# Patient Record
Sex: Female | Born: 1965 | Race: White | Hispanic: No | Marital: Married | Smoking: Never smoker
Health system: Southern US, Community
[De-identification: ages and names within clinical notes are randomized; demographics above are authoritative.]

## PROBLEM LIST (undated history)

## (undated) DIAGNOSIS — I10 Essential (primary) hypertension: Secondary | ICD-10-CM

## (undated) DIAGNOSIS — T7840XA Allergy, unspecified, initial encounter: Secondary | ICD-10-CM

## (undated) DIAGNOSIS — K760 Fatty (change of) liver, not elsewhere classified: Secondary | ICD-10-CM

## (undated) DIAGNOSIS — R748 Abnormal levels of other serum enzymes: Secondary | ICD-10-CM

## (undated) DIAGNOSIS — R519 Headache, unspecified: Secondary | ICD-10-CM

## (undated) DIAGNOSIS — K219 Gastro-esophageal reflux disease without esophagitis: Secondary | ICD-10-CM

## (undated) DIAGNOSIS — E785 Hyperlipidemia, unspecified: Secondary | ICD-10-CM

## (undated) DIAGNOSIS — E079 Disorder of thyroid, unspecified: Secondary | ICD-10-CM

## (undated) DIAGNOSIS — R7303 Prediabetes: Secondary | ICD-10-CM

## (undated) DIAGNOSIS — F419 Anxiety disorder, unspecified: Secondary | ICD-10-CM

## (undated) HISTORY — DX: Essential (primary) hypertension: I10

## (undated) HISTORY — DX: Anxiety disorder, unspecified: F41.9

## (undated) HISTORY — DX: Hyperlipidemia, unspecified: E78.5

## (undated) HISTORY — DX: Allergy, unspecified, initial encounter: T78.40XA

## (undated) HISTORY — DX: Disorder of thyroid, unspecified: E07.9

---

## 2004-05-09 ENCOUNTER — Ambulatory Visit: Payer: Self-pay | Admitting: Family Medicine

## 2005-09-30 ENCOUNTER — Ambulatory Visit: Payer: Self-pay

## 2007-05-04 ENCOUNTER — Ambulatory Visit: Payer: Self-pay | Admitting: General Surgery

## 2007-05-08 ENCOUNTER — Ambulatory Visit: Payer: Self-pay | Admitting: General Surgery

## 2007-06-01 ENCOUNTER — Ambulatory Visit: Payer: Self-pay | Admitting: General Surgery

## 2007-06-04 ENCOUNTER — Ambulatory Visit: Payer: Self-pay | Admitting: General Surgery

## 2007-06-04 HISTORY — PX: CHOLECYSTECTOMY: SHX55

## 2009-06-11 IMAGING — US ABDOMEN ULTRASOUND
1 series · 17 of 25 positions shown · non-contrast
Comparison: none

REASON FOR EXAM: abdominal pain  Ct showed thick gallbladder wall
COMMENTS:

[Series 1: abdomen ultrasound · 17 of 70 slices shown]
[im 1/70]
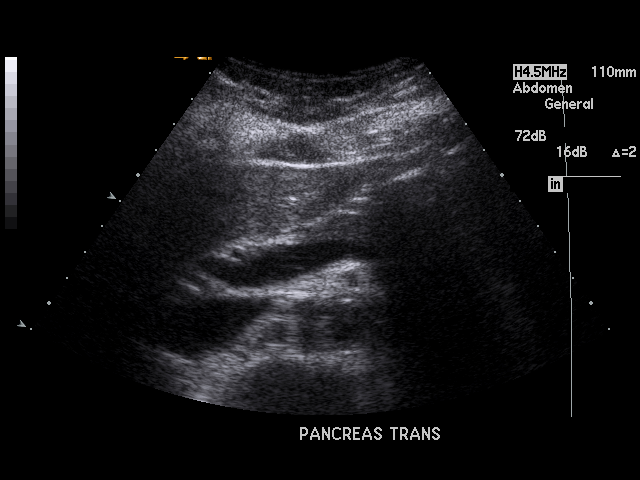
[im 6/70]
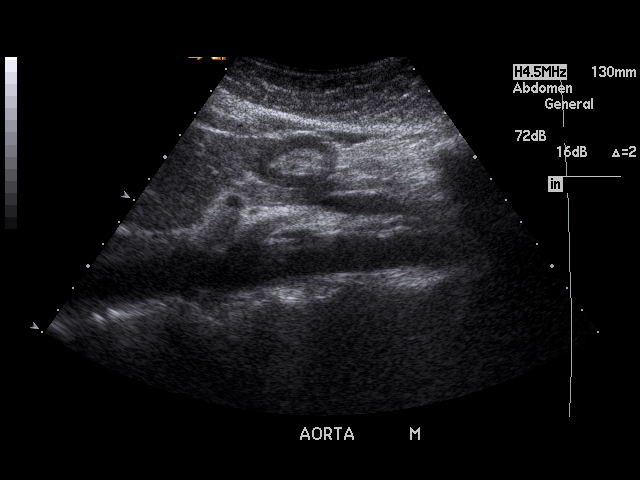
[im 9/70]
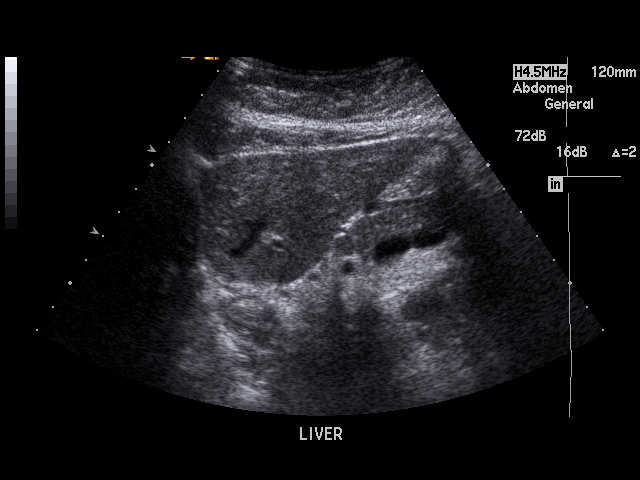
[im 15/70]
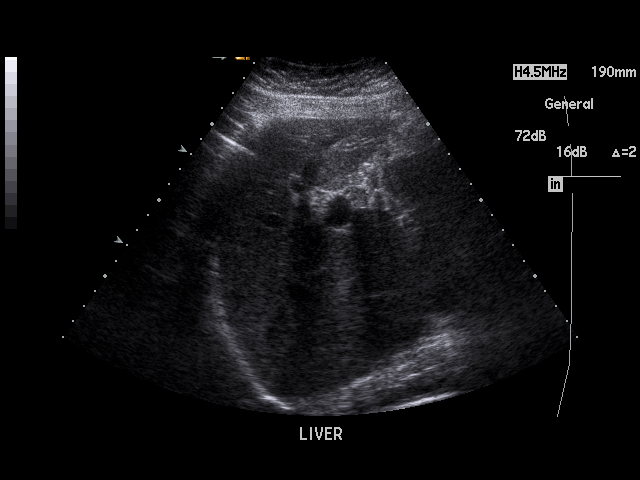
[im 18/70]
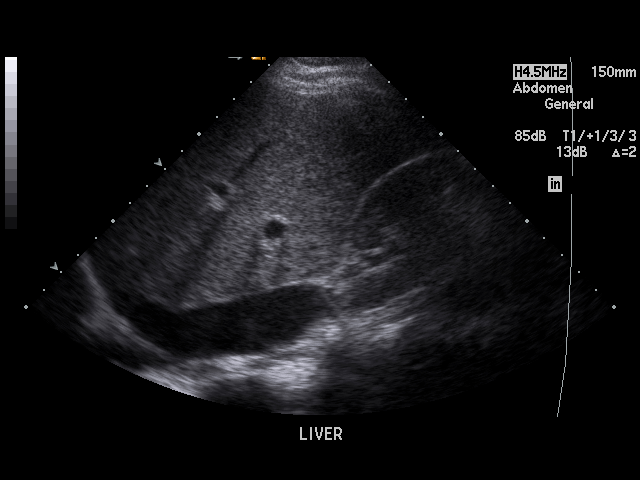
[im 24/70]
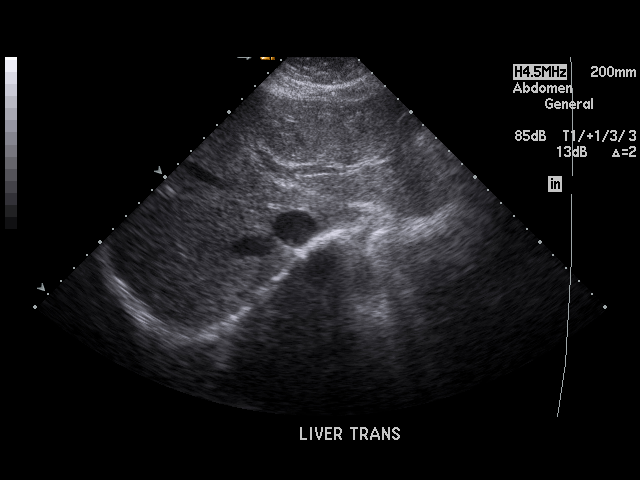
[im 26/70]
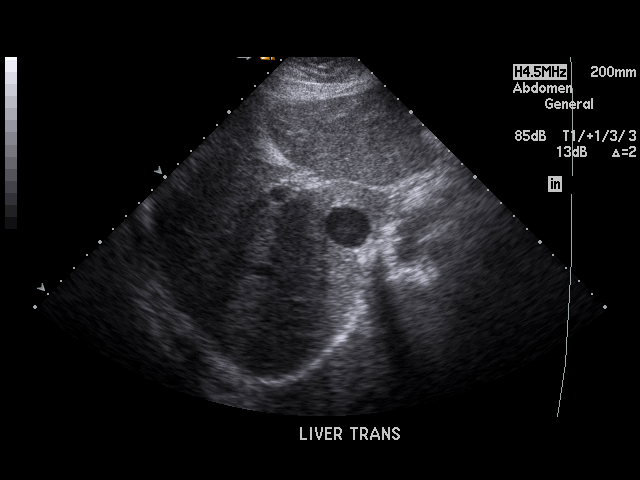
[im 32/70]
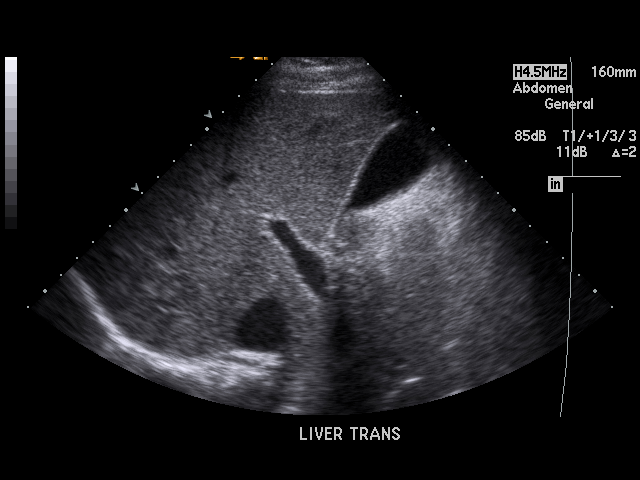
[im 35/70]
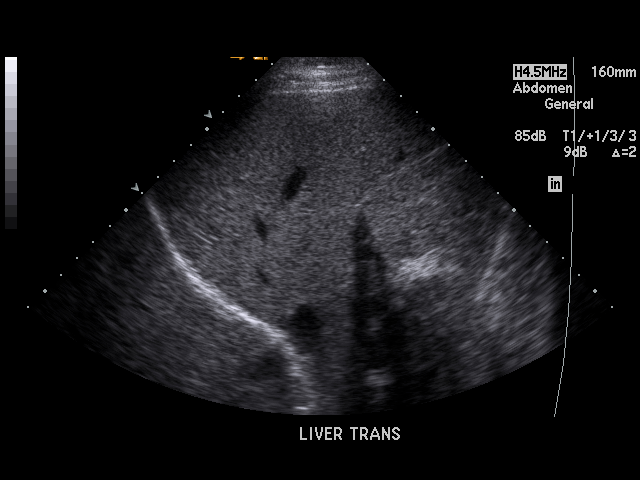
[im 38/70]
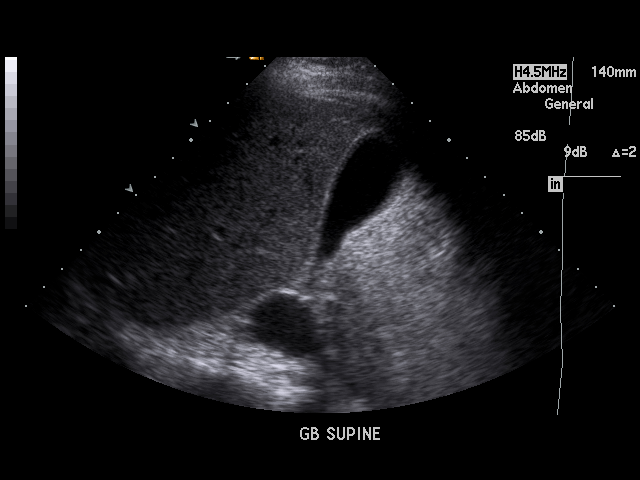
[im 44/70]
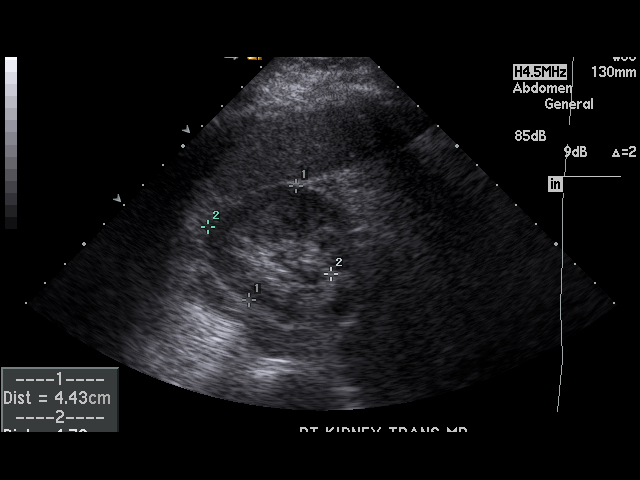
[im 47/70]
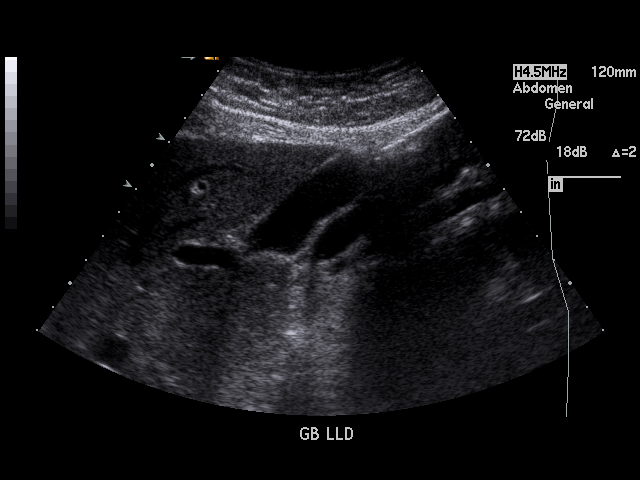
[im 52/70]
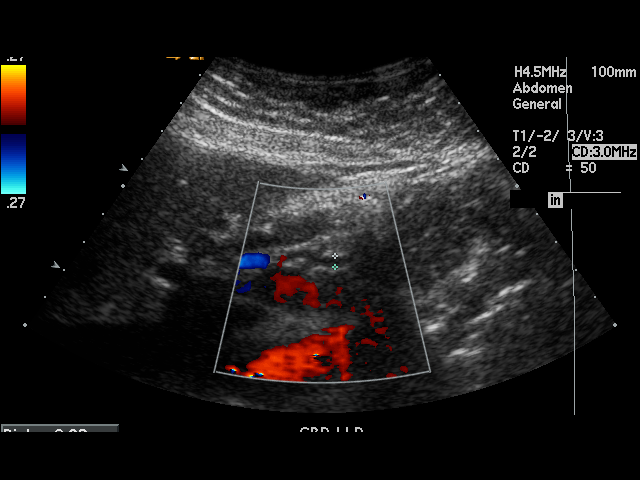
[im 55/70]
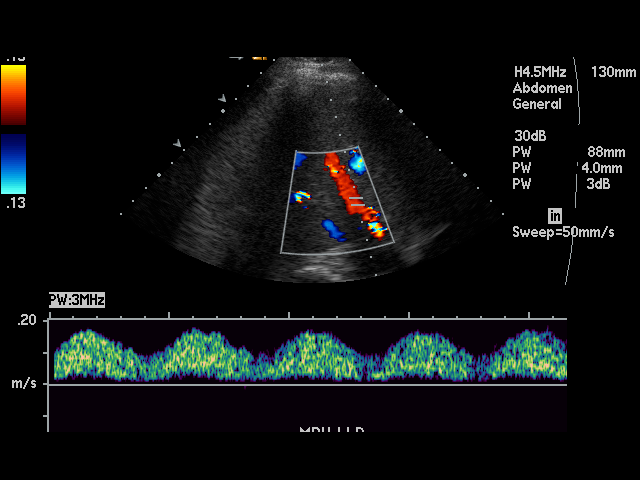
[im 61/70]
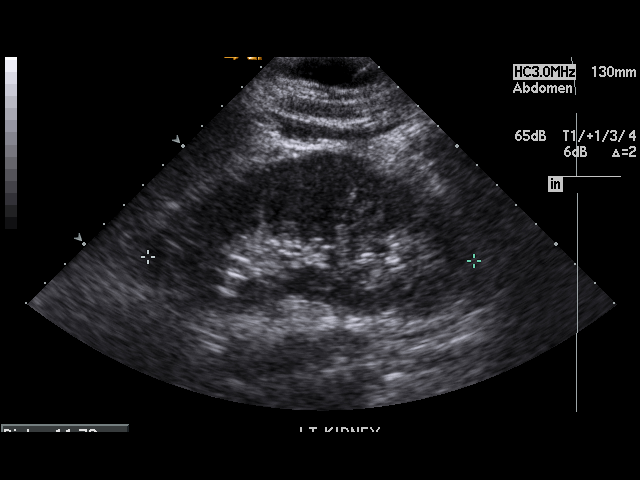
[im 64/70]
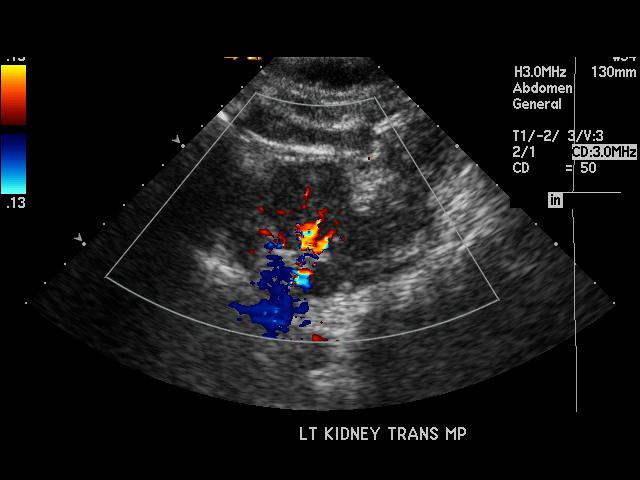
[im 70/70]
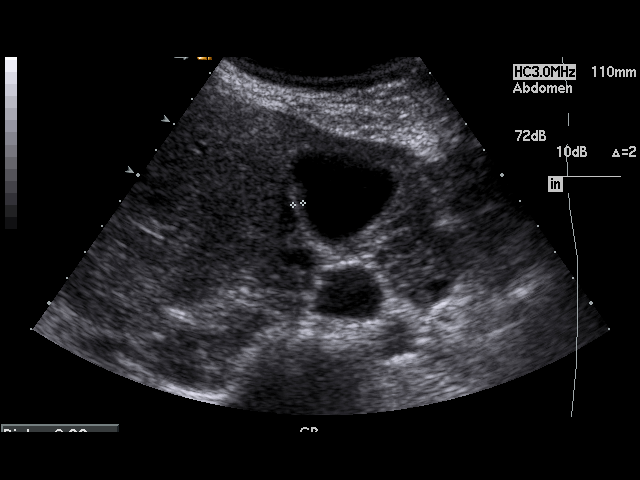

[17 of 25 positions shown; findings below may reference images not displayed]

PROCEDURE:     US  - US ABDOMEN GENERAL SURVEY  - May 08, 2007  [DATE]

RESULT:     The liver exhibits normal echotexture with no evidence of a mass
or of ductal dilation. Portal venous flow is normal in direction toward the
liver. The pancreas could only be partially demonstrated due to the presence
of bowel gas but was grossly normal. The gallbladder is adequately distended
with no evidence of stones, wall thickening, or pericholecystic fluid. The
common bile duct is normal at 3.2 mm in diameter. The spleen, abdominal
aorta, inferior vena cava, and kidneys are normal in appearance. There is no
evidence of ascites.
IMPRESSION: 1.Normal abdominal ultrasound examination. The gallbladder wall is top
normal in size at 3.3 mm but no pericholecystic fluid or sonographic
Murphy's sign was demonstrated.

## 2010-06-07 ENCOUNTER — Ambulatory Visit: Payer: Self-pay

## 2011-08-29 LAB — HM MAMMOGRAPHY: HM Mammogram: NEGATIVE

## 2012-10-15 ENCOUNTER — Ambulatory Visit: Payer: Self-pay | Admitting: Gastroenterology

## 2012-10-15 LAB — HEPATIC FUNCTION PANEL A (ARMC)
Alkaline Phosphatase: 123 U/L (ref 50–136)
Bilirubin, Direct: 0.1 mg/dL (ref 0.00–0.20)
SGOT(AST): 19 U/L (ref 15–37)
SGPT (ALT): 27 U/L (ref 12–78)
Total Protein: 8 g/dL (ref 6.4–8.2)

## 2012-10-27 ENCOUNTER — Ambulatory Visit: Payer: Self-pay | Admitting: Family Medicine

## 2012-12-29 LAB — HM COLONOSCOPY

## 2013-12-09 LAB — TSH: TSH: 0.6 u[IU]/mL (ref <=5.90)

## 2014-03-01 ENCOUNTER — Ambulatory Visit: Payer: Self-pay | Admitting: Family Medicine

## 2014-11-26 ENCOUNTER — Other Ambulatory Visit: Payer: Self-pay | Admitting: Family Medicine

## 2014-11-26 DIAGNOSIS — E039 Hypothyroidism, unspecified: Secondary | ICD-10-CM

## 2014-11-28 DIAGNOSIS — E039 Hypothyroidism, unspecified: Secondary | ICD-10-CM | POA: Insufficient documentation

## 2014-11-28 NOTE — Telephone Encounter (Signed)
Last OV 12/06/13

## 2014-12-10 ENCOUNTER — Other Ambulatory Visit: Payer: Self-pay | Admitting: Family Medicine

## 2014-12-10 DIAGNOSIS — I10 Essential (primary) hypertension: Secondary | ICD-10-CM

## 2014-12-12 DIAGNOSIS — I10 Essential (primary) hypertension: Secondary | ICD-10-CM | POA: Insufficient documentation

## 2014-12-12 NOTE — Telephone Encounter (Signed)
Last OV 12/06/13

## 2015-01-14 ENCOUNTER — Other Ambulatory Visit: Payer: Self-pay | Admitting: Family Medicine

## 2015-01-14 DIAGNOSIS — I10 Essential (primary) hypertension: Secondary | ICD-10-CM

## 2015-01-16 NOTE — Telephone Encounter (Signed)
Last OV 11/2013  Thanks,   -Laura  

## 2015-02-28 ENCOUNTER — Other Ambulatory Visit: Payer: Self-pay | Admitting: Family Medicine

## 2015-02-28 DIAGNOSIS — I10 Essential (primary) hypertension: Secondary | ICD-10-CM

## 2015-02-28 MED ORDER — HYDROCHLOROTHIAZIDE 12.5 MG PO TABS: 12.5000 mg | ORAL_TABLET | Freq: Every day | ORAL | Status: DC

## 2015-03-01 ENCOUNTER — Ambulatory Visit (INDEPENDENT_AMBULATORY_CARE_PROVIDER_SITE_OTHER): Payer: BLUE CROSS/BLUE SHIELD | Admitting: Family Medicine

## 2015-03-01 ENCOUNTER — Encounter: Payer: Self-pay | Admitting: Family Medicine

## 2015-03-01 VITALS — BP 134/82 | HR 84 | Temp 98.2°F | Resp 16 | Ht 65.0 in | Wt 156.0 lb

## 2015-03-01 DIAGNOSIS — F419 Anxiety disorder, unspecified: Secondary | ICD-10-CM | POA: Insufficient documentation

## 2015-03-01 DIAGNOSIS — M797 Fibromyalgia: Secondary | ICD-10-CM | POA: Diagnosis not present

## 2015-03-01 DIAGNOSIS — E039 Hypothyroidism, unspecified: Secondary | ICD-10-CM | POA: Diagnosis not present

## 2015-03-01 DIAGNOSIS — E162 Hypoglycemia, unspecified: Secondary | ICD-10-CM | POA: Insufficient documentation

## 2015-03-01 DIAGNOSIS — M1990 Unspecified osteoarthritis, unspecified site: Secondary | ICD-10-CM | POA: Insufficient documentation

## 2015-03-01 DIAGNOSIS — I10 Essential (primary) hypertension: Secondary | ICD-10-CM | POA: Diagnosis not present

## 2015-03-01 DIAGNOSIS — K589 Irritable bowel syndrome without diarrhea: Secondary | ICD-10-CM | POA: Insufficient documentation

## 2015-03-01 DIAGNOSIS — G43909 Migraine, unspecified, not intractable, without status migrainosus: Secondary | ICD-10-CM | POA: Insufficient documentation

## 2015-03-01 DIAGNOSIS — M2550 Pain in unspecified joint: Secondary | ICD-10-CM | POA: Insufficient documentation

## 2015-03-01 DIAGNOSIS — Z23 Encounter for immunization: Secondary | ICD-10-CM

## 2015-03-01 DIAGNOSIS — R748 Abnormal levels of other serum enzymes: Secondary | ICD-10-CM | POA: Insufficient documentation

## 2015-03-01 DIAGNOSIS — J309 Allergic rhinitis, unspecified: Secondary | ICD-10-CM | POA: Insufficient documentation

## 2015-03-01 DIAGNOSIS — R7989 Other specified abnormal findings of blood chemistry: Secondary | ICD-10-CM | POA: Insufficient documentation

## 2015-03-01 DIAGNOSIS — R1030 Lower abdominal pain, unspecified: Secondary | ICD-10-CM | POA: Insufficient documentation

## 2015-03-01 DIAGNOSIS — D649 Anemia, unspecified: Secondary | ICD-10-CM | POA: Insufficient documentation

## 2015-03-01 DIAGNOSIS — Z6824 Body mass index (BMI) 24.0-24.9, adult: Secondary | ICD-10-CM | POA: Insufficient documentation

## 2015-03-01 MED ORDER — HYDROCHLOROTHIAZIDE 12.5 MG PO TABS: 12.5000 mg | ORAL_TABLET | Freq: Every day | ORAL | Status: DC

## 2015-03-01 NOTE — Progress Notes (Signed)
Subjective:     Patient ID: Tami Finley, female   DOB: [birth year], 49 y.o.   MRN: [id number]  Hypertension The problem is controlled. Associated symptoms include anxiety, malaise/fatigue and peripheral edema. Pertinent negatives include no chest pain, headaches, neck pain, palpitations, shortness of breath or sweats. Past treatments include diuretics. There are no compliance problems.  Hypertensive end-organ damage includes a thyroid problem. There is no history of chronic renal disease.  Anxiety Presents for follow-up visit. Symptoms include nausea and nervous/anxious behavior. Patient reports no chest pain, dizziness, palpitations or shortness of breath. Symptoms occur most days. The severity of symptoms is mild.    Thyroid Problem Presents for follow-up visit. Symptoms include anxiety, cold intolerance, constipation, diarrhea, fatigue and hoarse voice. Patient reports no diaphoresis, heat intolerance, palpitations, weight gain or weight loss. The symptoms have been stable. There are no known risk factors.  Sinus Problem The current episode started in the past 7 days. There has been no fever. Associated symptoms include congestion, coughing, a hoarse voice, sinus pressure and a sore throat. Pertinent negatives include no chills, diaphoresis, ear pain, headaches, neck pain or shortness of breath. Past treatments include nothing. The treatment provided no relief (Feels realy more allergies.  ).  Irritable Bowel Syndrome Patient complains of abdominal cramping, abdominal pain and irritable bowel syndrome. Onset of symptoms was several years ago. Current symptoms: crampy abdominal pain: mild: location: in the lower abdomen. Patient denies dysuria, fever, frequency and vomiting. Symptoms have gradually worsened. Previous visits for this problem: multiple, this is a longstanding diagnosis. Last seen a few years ago by me. Planning to return to no gluten diet.    Patient Active Problem List   Diagnosis  Date Noted  . Allergic rhinitis 03/01/2015  . Absolute anemia 03/01/2015  . Anxiety 03/01/2015  . Arthritis 03/01/2015  . Body mass index (BMI) of 24.0-24.9 in adult 03/01/2015  . Abnormal C-reactive protein 03/01/2015  . Elevated lipase 03/01/2015  . Abnormal liver enzymes 03/01/2015  . Fibrositis 03/01/2015  . BP (high blood pressure) 03/01/2015  . Adaptive colitis 03/01/2015  . Ache in joint 03/01/2015  . Hypoglycemia 03/01/2015  . Abdominal pain, lower 03/01/2015  . Headache, migraine 03/01/2015  . Hypertension 12/12/2014  . Hypothyroidism 11/28/2014   Family History  Problem Relation Age of Onset  . Hypertension Mother   . Hyperlipidemia Mother   . Thyroid disease Mother   . Hypertension Father   . Hyperlipidemia Father   . Diabetes Sister   . Hypertension Sister   . Thyroid disease Sister   . Thyroid disease Maternal Grandmother    Social History   Social History  . Marital Status: Married    Spouse Name: N/A  . Number of Children: 2  . Years of Education: H/S   Occupational History  . Full-Time    Social History Main Topics  . Smoking status: Never Smoker   . Smokeless tobacco: Never Used  . Alcohol Use: Yes  . Drug Use: No  . Sexual Activity: Not on file   Other Topics Concern  . Not on file   Social History Narrative   Past Surgical History  Procedure Laterality Date  . Cholecystectomy  06/04/2007   Allergies  Allergen Reactions  . Penicillins Rash   Previous Medications   ALPRAZOLAM (XANAX) 0.5 MG TABLET    Take 1 tablet by mouth daily.   ARMOUR THYROID 30 MG TABLET    TAKE 1 TABLET BY MOUTH EVERY DAY   FLUOXETINE (PROZAC)  20 MG CAPSULE    Take 1 capsule by mouth daily.   HYDROCHLOROTHIAZIDE (HYDRODIURIL) 12.5 MG TABLET    Take 1 tablet (12.5 mg total) by mouth daily.   IBUPROFEN (ADVIL,MOTRIN) 800 MG TABLET    Take 800 mg by mouth 3 (three) times daily.   NORTRIPTYLINE (PAMELOR) 50 MG CAPSULE       NUCYNTA ER 50 MG TB12       PROBIOTIC  CAPS    Take 1 capsule by mouth daily.   TOPROL XL 25 MG 24 HR TABLET       BP 134/82 mmHg  Pulse 84  Temp(Src) 98.2 F (36.8 C) (Oral)  Resp 16  Ht 5\' 5"  (1.651 m)  Wt 156 lb (70.761 kg)  BMI 25.96 kg/m2    Review of Systems  Constitutional: Positive for malaise/fatigue and fatigue. Negative for fever, chills, weight loss, weight gain, diaphoresis, activity change, appetite change and unexpected weight change.  HENT: Positive for congestion, facial swelling, hoarse voice, postnasal drip, rhinorrhea, sinus pressure, sore throat, tinnitus, trouble swallowing and voice change. Negative for ear discharge, ear pain and nosebleeds.   Eyes: Positive for pain (Very dry) and itching. Negative for photophobia, discharge, redness and visual disturbance.  Respiratory: Positive for cough. Negative for apnea, choking, chest tightness, shortness of breath, wheezing and stridor.   Cardiovascular: Positive for leg swelling. Negative for chest pain and palpitations.  Gastrointestinal: Positive for nausea, abdominal pain (Cramping), diarrhea, constipation and abdominal distention (Bloating). Negative for vomiting, blood in stool, anal bleeding and rectal pain.       HIstory of IBS   Endocrine: Positive for cold intolerance. Negative for heat intolerance, polydipsia, polyphagia and polyuria.  Musculoskeletal: Negative for myalgias, back pain, joint swelling, arthralgias, gait problem, neck pain and neck stiffness.  Neurological: Positive for light-headedness. Negative for dizziness and headaches.  Psychiatric/Behavioral: The patient is nervous/anxious.        Objective:   Physical Exam  Constitutional: She is oriented to person, place, and time. She appears well-developed and well-nourished.  Cardiovascular: Normal rate and regular rhythm.   Pulmonary/Chest: Effort normal and breath sounds normal.  Neurological: She is alert and oriented to person, place, and time.  Psychiatric: She has a normal  mood and affect. Her behavior is normal. Judgment and thought content normal.   BP 134/82 mmHg  Pulse 84  Temp(Src) 98.2 F (36.8 C) (Oral)  Resp 16  Ht 5\' 5"  (1.651 m)  Wt 156 lb (70.761 kg)  BMI 25.96 kg/m2     Assessment:     See below.     Plan:     1. Essential hypertension Stable. Continue medication, check labs and increase exercise.  - hydrochlorothiazide (HYDRODIURIL) 12.5 MG tablet; Take 1 tablet (12.5 mg total) by mouth daily.  Dispense: 90 tablet; Refill: 3 - CBC with Differential/Platelet - Comprehensive metabolic panel  2. Hypothyroidism, unspecified hypothyroidism type Continue medication. Check labs and adjust as needed.  - T4 AND TSH  3. Anxiety Stable.   4. IBS (irritable bowel syndrome) Try Gluten free diet again.   5. Flu vaccine need Given flu shot today.  - Flu Vaccine QUAD 36+ mos PF IM (Fluarix & Fluzone Quad PF)  6. Fibromyalgia Stable. Continue with Pain Clinic in [place].   Also will try gluten free diet again.   Margarita Rana, MD

## 2015-03-19 ENCOUNTER — Other Ambulatory Visit: Payer: Self-pay | Admitting: Family Medicine

## 2015-03-19 DIAGNOSIS — I10 Essential (primary) hypertension: Secondary | ICD-10-CM

## 2015-05-03 ENCOUNTER — Other Ambulatory Visit: Payer: Self-pay | Admitting: Family Medicine

## 2015-05-03 DIAGNOSIS — I10 Essential (primary) hypertension: Secondary | ICD-10-CM

## 2015-06-07 ENCOUNTER — Other Ambulatory Visit: Payer: Self-pay

## 2015-06-07 DIAGNOSIS — M797 Fibromyalgia: Secondary | ICD-10-CM

## 2015-06-07 NOTE — Telephone Encounter (Signed)
Please clarify why we are filling this.  Thought she was seeing pain clinic. Thanks.

## 2015-06-08 NOTE — Telephone Encounter (Signed)
Pt says you are the only one who fills this.   Thanks,   -Mickel Baas

## 2015-06-09 MED ORDER — HYDROCODONE-ACETAMINOPHEN 5-325 MG PO TABS: 1.0000 | ORAL_TABLET | Freq: Four times a day (QID) | ORAL | Status: DC | PRN

## 2015-06-09 NOTE — Telephone Encounter (Signed)
Prescription printed. Please notify patient it is ready for pick up. Thanks- Dr. Retia Cordle.  

## 2015-06-14 ENCOUNTER — Other Ambulatory Visit: Payer: Self-pay | Admitting: Family Medicine

## 2015-06-14 DIAGNOSIS — E039 Hypothyroidism, unspecified: Secondary | ICD-10-CM

## 2015-08-31 DIAGNOSIS — Z79899 Other long term (current) drug therapy: Secondary | ICD-10-CM | POA: Diagnosis not present

## 2015-08-31 DIAGNOSIS — M791 Myalgia: Secondary | ICD-10-CM | POA: Diagnosis not present

## 2015-08-31 DIAGNOSIS — M064 Inflammatory polyarthropathy: Secondary | ICD-10-CM | POA: Diagnosis not present

## 2015-08-31 DIAGNOSIS — R21 Rash and other nonspecific skin eruption: Secondary | ICD-10-CM | POA: Diagnosis not present

## 2015-09-12 DIAGNOSIS — M5136 Other intervertebral disc degeneration, lumbar region: Secondary | ICD-10-CM | POA: Diagnosis not present

## 2015-09-14 ENCOUNTER — Encounter: Payer: Self-pay | Admitting: Family Medicine

## 2015-09-19 DIAGNOSIS — L820 Inflamed seborrheic keratosis: Secondary | ICD-10-CM | POA: Diagnosis not present

## 2015-09-19 DIAGNOSIS — D229 Melanocytic nevi, unspecified: Secondary | ICD-10-CM | POA: Diagnosis not present

## 2015-09-19 DIAGNOSIS — D485 Neoplasm of uncertain behavior of skin: Secondary | ICD-10-CM | POA: Diagnosis not present

## 2015-09-19 DIAGNOSIS — L814 Other melanin hyperpigmentation: Secondary | ICD-10-CM | POA: Diagnosis not present

## 2015-09-19 DIAGNOSIS — Z1283 Encounter for screening for malignant neoplasm of skin: Secondary | ICD-10-CM | POA: Diagnosis not present

## 2015-10-03 ENCOUNTER — Other Ambulatory Visit: Payer: Self-pay | Admitting: Family Medicine

## 2015-10-03 ENCOUNTER — Other Ambulatory Visit: Payer: Self-pay | Admitting: Certified Nurse Midwife

## 2015-10-03 DIAGNOSIS — Z1231 Encounter for screening mammogram for malignant neoplasm of breast: Secondary | ICD-10-CM

## 2015-10-03 DIAGNOSIS — I10 Essential (primary) hypertension: Secondary | ICD-10-CM

## 2015-10-13 DIAGNOSIS — N951 Menopausal and female climacteric states: Secondary | ICD-10-CM | POA: Diagnosis not present

## 2015-10-13 DIAGNOSIS — N912 Amenorrhea, unspecified: Secondary | ICD-10-CM | POA: Diagnosis not present

## 2015-10-13 DIAGNOSIS — Z124 Encounter for screening for malignant neoplasm of cervix: Secondary | ICD-10-CM | POA: Diagnosis not present

## 2015-10-13 DIAGNOSIS — Z01419 Encounter for gynecological examination (general) (routine) without abnormal findings: Secondary | ICD-10-CM | POA: Diagnosis not present

## 2015-10-19 DIAGNOSIS — Z79899 Other long term (current) drug therapy: Secondary | ICD-10-CM | POA: Diagnosis not present

## 2015-10-19 DIAGNOSIS — M064 Inflammatory polyarthropathy: Secondary | ICD-10-CM | POA: Diagnosis not present

## 2015-11-01 ENCOUNTER — Other Ambulatory Visit: Payer: Self-pay | Admitting: Family Medicine

## 2015-11-30 DIAGNOSIS — Z79899 Other long term (current) drug therapy: Secondary | ICD-10-CM | POA: Diagnosis not present

## 2015-11-30 DIAGNOSIS — M064 Inflammatory polyarthropathy: Secondary | ICD-10-CM | POA: Diagnosis not present

## 2015-12-12 DIAGNOSIS — M546 Pain in thoracic spine: Secondary | ICD-10-CM | POA: Diagnosis not present

## 2015-12-12 DIAGNOSIS — M545 Low back pain: Secondary | ICD-10-CM | POA: Diagnosis not present

## 2015-12-12 DIAGNOSIS — M5030 Other cervical disc degeneration, unspecified cervical region: Secondary | ICD-10-CM | POA: Diagnosis not present

## 2015-12-12 DIAGNOSIS — Z79891 Long term (current) use of opiate analgesic: Secondary | ICD-10-CM | POA: Diagnosis not present

## 2015-12-12 DIAGNOSIS — M5136 Other intervertebral disc degeneration, lumbar region: Secondary | ICD-10-CM | POA: Diagnosis not present

## 2015-12-19 ENCOUNTER — Encounter: Payer: Self-pay | Admitting: Family Medicine

## 2015-12-19 ENCOUNTER — Other Ambulatory Visit: Payer: Self-pay | Admitting: Family Medicine

## 2015-12-19 DIAGNOSIS — E039 Hypothyroidism, unspecified: Secondary | ICD-10-CM

## 2015-12-20 NOTE — Telephone Encounter (Signed)
Needs appt with Dr Caryn Section or a PA this fall.

## 2016-02-25 ENCOUNTER — Other Ambulatory Visit: Payer: Self-pay | Admitting: Family Medicine

## 2016-02-25 DIAGNOSIS — E039 Hypothyroidism, unspecified: Secondary | ICD-10-CM

## 2016-02-28 DIAGNOSIS — E038 Other specified hypothyroidism: Secondary | ICD-10-CM | POA: Diagnosis not present

## 2016-02-28 DIAGNOSIS — M064 Inflammatory polyarthropathy: Secondary | ICD-10-CM | POA: Diagnosis not present

## 2016-02-28 DIAGNOSIS — Z79899 Other long term (current) drug therapy: Secondary | ICD-10-CM | POA: Diagnosis not present

## 2016-03-12 DIAGNOSIS — M5136 Other intervertebral disc degeneration, lumbar region: Secondary | ICD-10-CM | POA: Diagnosis not present

## 2016-03-22 ENCOUNTER — Ambulatory Visit: Payer: Self-pay | Admitting: Family Medicine

## 2016-03-25 ENCOUNTER — Ambulatory Visit (INDEPENDENT_AMBULATORY_CARE_PROVIDER_SITE_OTHER): Payer: BLUE CROSS/BLUE SHIELD | Admitting: Family Medicine

## 2016-03-25 ENCOUNTER — Encounter: Payer: Self-pay | Admitting: Family Medicine

## 2016-03-25 VITALS — BP 128/78 | HR 64 | Temp 98.4°F | Resp 16 | Wt 160.2 lb

## 2016-03-25 DIAGNOSIS — F419 Anxiety disorder, unspecified: Secondary | ICD-10-CM

## 2016-03-25 DIAGNOSIS — E039 Hypothyroidism, unspecified: Secondary | ICD-10-CM | POA: Diagnosis not present

## 2016-03-25 DIAGNOSIS — M797 Fibromyalgia: Secondary | ICD-10-CM

## 2016-03-25 DIAGNOSIS — Z23 Encounter for immunization: Secondary | ICD-10-CM

## 2016-03-25 DIAGNOSIS — I10 Essential (primary) hypertension: Secondary | ICD-10-CM

## 2016-03-25 MED ORDER — HYDROCHLOROTHIAZIDE 12.5 MG PO TABS: ORAL_TABLET | ORAL | 3 refills | Status: DC

## 2016-03-25 MED ORDER — THYROID 30 MG PO TABS: 30.0000 mg | ORAL_TABLET | Freq: Every day | ORAL | 3 refills | Status: DC

## 2016-03-25 NOTE — Progress Notes (Signed)
Patient: Tami Finley Female    DOB: February 20, 1966   50 y.o.   MRN: [id number] Visit Date: 03/25/2016  Today's Provider: Vernie Murders, PA   Chief Complaint  Patient presents with  . Hypothyroidism  . Hypertension  . Anxiety  . Follow-up   Subjective:    HPI Patient is here for follow up and establish care with Tami Finley since Dr Venia Minks left. Patient is doing well on current medication regimen.   Hypothyroid, follow-up:  TSH  Date Value Ref Range Status  12/09/2013 0.60 0.41 - 5.90 uIU/mL Final   Wt Readings from Last 3 Encounters:  03/25/16 160 lb 3.2 oz (72.7 kg)  03/01/15 156 lb (70.8 kg)  12/06/13 150 lb (68 kg)    She was last seen for hypothyroid 1 years ago.  Management since that visit includes continue medication. She reports excellent compliance with treatment. She is not having side effects.  She is exercising. She is experiencing none and hot flashes, night sweats She denies none Weight trend: stable  ------------------------------------------------------------------------  Hypertension, follow-up:  BP Readings from Last 3 Encounters:  03/25/16 128/78  03/01/15 134/82  12/06/13 124/82    She was last seen for hypertension 1 years ago.  BP at that visit was 134/82. Management changes since that visit include continue medication. She reports excellent compliance with treatment. She is not having side effects.  She is exercising. She is adherent to low salt diet.   Outside blood pressures are not being checked. She is experiencing none.  Patient denies chest pain, irregular heart beat and palpitations.   Cardiovascular risk factors include none.  Use of agents associated with hypertension: none.     Weight trend: stable Wt Readings from Last 3 Encounters:  03/25/16 160 lb 3.2 oz (72.7 kg)  03/01/15 156 lb (70.8 kg)  12/06/13 150 lb (68 kg)    Current diet: in general, a "healthy" diet     ------------------------------------------------------------------------ Anxiety: 1 year follow up. Patient reports she discontinued Prozac and Alprazolam.  Current symptoms are stable.   Past Medical History:  Diagnosis Date  . Allergy   . Anxiety   . Hyperlipidemia   . Hypertension   . Thyroid disease    Past Surgical History:  Procedure Laterality Date  . CHOLECYSTECTOMY  06/04/2007   Family History  Problem Relation Age of Onset  . Hypertension Mother   . Hyperlipidemia Mother   . Thyroid disease Mother   . Hypertension Father   . Hyperlipidemia Father   . Diabetes Sister   . Hypertension Sister   . Thyroid disease Sister   . Thyroid disease Maternal Grandmother    Allergies  Allergen Reactions  . Penicillins Rash   Previous Medications   ARMOUR THYROID 30 MG TABLET    TAKE 1 TABLET BY MOUTH EVERY DAY   HYDROCHLOROTHIAZIDE (HYDRODIURIL) 12.5 MG TABLET    TAKE 1 TABLET (12.5 MG TOTAL) BY MOUTH DAILY.   HYDROCODONE-ACETAMINOPHEN (NORCO/VICODIN) 5-325 MG TABLET    Take 1 tablet by mouth every 6 (six) hours as needed for moderate pain.   METOPROLOL SUCCINATE (TOPROL-XL) 25 MG 24 HR TABLET    TAKE 1 TABLET DAILY   NUCYNTA ER 50 MG TB12       PROBIOTIC CAPS    Take 1 capsule by mouth daily.    Review of Systems  Constitutional: Negative.   Respiratory: Negative.   Cardiovascular: Negative.     Social History  Substance Use Topics  . Smoking status: Never  Smoker  . Smokeless tobacco: Never Used  . Alcohol use Yes   Objective:   BP 128/78 (BP Location: Right Arm, Patient Position: Sitting, Cuff Size: Normal)   Pulse 64   Temp 98.4 F (36.9 C) (Oral)   Resp 16   Wt 160 lb 3.2 oz (72.7 kg)   BMI 26.66 kg/m   Physical Exam  Constitutional: She is oriented to person, place, and time. She appears well-developed and well-nourished. No distress.  HENT:  Head: Normocephalic and atraumatic.  Right Ear: Hearing normal.  Left Ear: Hearing normal.  Nose: Nose  normal.  Eyes: Conjunctivae and lids are normal. Right eye exhibits no discharge. Left eye exhibits no discharge. No scleral icterus.  Neck: Neck supple. No thyromegaly present.  Cardiovascular: Normal rate and regular rhythm.   Pulmonary/Chest: Effort normal and breath sounds normal. No respiratory distress.  Abdominal: Soft. Bowel sounds are normal.  Musculoskeletal: Normal range of motion.  Neurological: She is alert and oriented to person, place, and time.  Skin: Skin is intact. No lesion and no rash noted.  Psychiatric: She has a normal mood and affect. Her speech is normal and behavior is normal. Thought content normal.      Assessment & Plan:     1. Essential hypertension Well controlled and tolerating HCTZ 12,5 mg qd with Metoprolol Succinate 25 mg qd without side effects. Reviewed recent labs from Dr. Alroy Dust (rheumatologist) done 02-28-16. CBC, CMP and thyroid panel essentially normal. Will follow up in a year. Annual physical at [facility] scheduled in a month or two. - hydrochlorothiazide (HYDRODIURIL) 12.5 MG tablet; TAKE 1 TABLET (12.5 MG TOTAL) BY MOUTH DAILY.  Dispense: 90 tablet; Refill: 3  2. Hypothyroidism, unspecified type Denies palpitations, chest pain, hair loss, brittle nails, constipation, depression, etc. Still taking the Armour Thyroid daily. Very good thyroid panel results on 02-28-16. Recheck annually. - thyroid (ARMOUR THYROID) 30 MG tablet; Take 1 tablet (30 mg total) by mouth daily.  Dispense: 90 tablet; Refill: 3  3. Fibromyalgia Multiple joint and shoulder/neck muscle pains. Presently taking Norco, Nucynta and Indomethacin prescribed by Dr. Alroy Dust [facility]) that she sees every 3 months.  4. Anxiety Stable and no panic attacks or suicidal ideation.   5. Need for influenza vaccination - Flu Vaccine QUAD 36+ mos PF IM (Fluarix & Fluzone Quad PF)

## 2016-04-23 ENCOUNTER — Other Ambulatory Visit: Payer: Self-pay | Admitting: Family Medicine

## 2016-04-23 DIAGNOSIS — I10 Essential (primary) hypertension: Secondary | ICD-10-CM

## 2016-04-25 NOTE — Telephone Encounter (Signed)
Was refilled with [facility] on 03-25-16 for a year.

## 2016-04-26 NOTE — Telephone Encounter (Signed)
Did [facility] send the medication to the patient or is she trying to change to a local pharmacy?

## 2016-05-06 DIAGNOSIS — H04129 Dry eye syndrome of unspecified lacrimal gland: Secondary | ICD-10-CM | POA: Diagnosis not present

## 2016-06-11 DIAGNOSIS — M545 Low back pain: Secondary | ICD-10-CM | POA: Diagnosis not present

## 2016-06-11 DIAGNOSIS — Z79891 Long term (current) use of opiate analgesic: Secondary | ICD-10-CM | POA: Diagnosis not present

## 2016-06-11 DIAGNOSIS — M5030 Other cervical disc degeneration, unspecified cervical region: Secondary | ICD-10-CM | POA: Diagnosis not present

## 2016-06-11 DIAGNOSIS — M546 Pain in thoracic spine: Secondary | ICD-10-CM | POA: Diagnosis not present

## 2016-06-11 DIAGNOSIS — M5136 Other intervertebral disc degeneration, lumbar region: Secondary | ICD-10-CM | POA: Diagnosis not present

## 2016-06-18 DIAGNOSIS — R350 Frequency of micturition: Secondary | ICD-10-CM | POA: Diagnosis not present

## 2016-06-18 DIAGNOSIS — M064 Inflammatory polyarthropathy: Secondary | ICD-10-CM | POA: Diagnosis not present

## 2016-06-18 DIAGNOSIS — E559 Vitamin D deficiency, unspecified: Secondary | ICD-10-CM | POA: Diagnosis not present

## 2016-06-18 DIAGNOSIS — Z79899 Other long term (current) drug therapy: Secondary | ICD-10-CM | POA: Diagnosis not present

## 2016-06-21 DIAGNOSIS — Z79899 Other long term (current) drug therapy: Secondary | ICD-10-CM | POA: Diagnosis not present

## 2016-06-27 ENCOUNTER — Encounter: Payer: Self-pay | Admitting: Family Medicine

## 2016-07-02 DIAGNOSIS — L821 Other seborrheic keratosis: Secondary | ICD-10-CM | POA: Diagnosis not present

## 2016-07-02 DIAGNOSIS — D229 Melanocytic nevi, unspecified: Secondary | ICD-10-CM | POA: Diagnosis not present

## 2016-09-10 DIAGNOSIS — M5136 Other intervertebral disc degeneration, lumbar region: Secondary | ICD-10-CM | POA: Diagnosis not present

## 2016-09-10 DIAGNOSIS — Z79891 Long term (current) use of opiate analgesic: Secondary | ICD-10-CM | POA: Diagnosis not present

## 2016-09-16 DIAGNOSIS — R202 Paresthesia of skin: Secondary | ICD-10-CM | POA: Diagnosis not present

## 2016-09-16 DIAGNOSIS — Z79899 Other long term (current) drug therapy: Secondary | ICD-10-CM | POA: Diagnosis not present

## 2016-09-16 DIAGNOSIS — M064 Inflammatory polyarthropathy: Secondary | ICD-10-CM | POA: Diagnosis not present

## 2016-09-16 DIAGNOSIS — M79672 Pain in left foot: Secondary | ICD-10-CM | POA: Diagnosis not present

## 2016-09-24 DIAGNOSIS — R202 Paresthesia of skin: Secondary | ICD-10-CM | POA: Diagnosis not present

## 2016-10-16 DIAGNOSIS — M19071 Primary osteoarthritis, right ankle and foot: Secondary | ICD-10-CM | POA: Diagnosis not present

## 2016-10-28 ENCOUNTER — Other Ambulatory Visit: Payer: Self-pay | Admitting: Family Medicine

## 2016-10-28 MED ORDER — METOPROLOL SUCCINATE ER 25 MG PO TB24: 25.0000 mg | ORAL_TABLET | Freq: Every day | ORAL | 3 refills | Status: DC

## 2016-10-28 NOTE — Telephone Encounter (Signed)
[facility] faxed a request on the following medications:    metoprolol succinate (TOPROL-XL) 25 MG 24 hr tablet.  Take 1 tablet daily.  90 day supply/MW

## 2016-11-06 DIAGNOSIS — M19071 Primary osteoarthritis, right ankle and foot: Secondary | ICD-10-CM | POA: Diagnosis not present

## 2016-12-04 DIAGNOSIS — M19071 Primary osteoarthritis, right ankle and foot: Secondary | ICD-10-CM | POA: Diagnosis not present

## 2016-12-10 DIAGNOSIS — M19071 Primary osteoarthritis, right ankle and foot: Secondary | ICD-10-CM | POA: Diagnosis not present

## 2017-02-28 ENCOUNTER — Other Ambulatory Visit: Payer: Self-pay | Admitting: Family Medicine

## 2017-02-28 DIAGNOSIS — E039 Hypothyroidism, unspecified: Secondary | ICD-10-CM

## 2017-02-28 DIAGNOSIS — I10 Essential (primary) hypertension: Secondary | ICD-10-CM

## 2017-03-11 DIAGNOSIS — Z79891 Long term (current) use of opiate analgesic: Secondary | ICD-10-CM | POA: Diagnosis not present

## 2017-03-11 DIAGNOSIS — M19071 Primary osteoarthritis, right ankle and foot: Secondary | ICD-10-CM | POA: Diagnosis not present

## 2017-04-07 DIAGNOSIS — M064 Inflammatory polyarthropathy: Secondary | ICD-10-CM | POA: Diagnosis not present

## 2017-04-07 DIAGNOSIS — Z79899 Other long term (current) drug therapy: Secondary | ICD-10-CM | POA: Diagnosis not present

## 2017-04-07 DIAGNOSIS — R5383 Other fatigue: Secondary | ICD-10-CM | POA: Diagnosis not present

## 2017-04-10 LAB — LIPID PANEL
Cholesterol: 224 — AB (ref 0–200)
HDL: 62 (ref 35–70)
LDL CALC: 128
TRIGLYCERIDES: 172 — AB (ref 40–160)

## 2017-04-10 LAB — HEPATIC FUNCTION PANEL
ALK PHOS: 132 — AB (ref 25–125)
ALT: 26 (ref 7–35)
AST: 19 (ref 13–35)
Bilirubin, Total: 0.2

## 2017-04-10 LAB — BASIC METABOLIC PANEL
BUN: 15 (ref 4–21)
Creatinine: 0.7 (ref 0.5–1.1)
Glucose: 89
Potassium: 4.3 (ref 3.4–5.3)
SODIUM: 140 (ref 137–147)

## 2017-04-10 LAB — CBC AND DIFFERENTIAL
Hemoglobin: 12.8 (ref 12.0–16.0)
NEUTROS ABS: 3
PLATELETS: 323 (ref 150–399)
WBC: 5.3

## 2017-04-10 LAB — TSH: TSH: 0.82 (ref 0.41–5.90)

## 2017-04-10 LAB — VITAMIN D 25 HYDROXY (VIT D DEFICIENCY, FRACTURES): Vit D, 25-Hydroxy: 32.9

## 2017-04-22 DIAGNOSIS — Z79899 Other long term (current) drug therapy: Secondary | ICD-10-CM | POA: Diagnosis not present

## 2017-06-24 DIAGNOSIS — Z1283 Encounter for screening for malignant neoplasm of skin: Secondary | ICD-10-CM | POA: Diagnosis not present

## 2017-06-24 DIAGNOSIS — D1800 Hemangioma unspecified site: Secondary | ICD-10-CM | POA: Diagnosis not present

## 2017-06-24 DIAGNOSIS — D485 Neoplasm of uncertain behavior of skin: Secondary | ICD-10-CM | POA: Diagnosis not present

## 2017-06-24 DIAGNOSIS — L821 Other seborrheic keratosis: Secondary | ICD-10-CM | POA: Diagnosis not present

## 2017-06-30 ENCOUNTER — Other Ambulatory Visit: Payer: Self-pay | Admitting: Family Medicine

## 2017-06-30 DIAGNOSIS — E039 Hypothyroidism, unspecified: Secondary | ICD-10-CM

## 2017-06-30 NOTE — Telephone Encounter (Signed)
Patient is requesting a refill on the following medication  ARMOUR THYROID 30 MG tablet   She uses [facility]

## 2017-07-02 ENCOUNTER — Other Ambulatory Visit: Payer: Self-pay | Admitting: Family Medicine

## 2017-07-02 DIAGNOSIS — E039 Hypothyroidism, unspecified: Secondary | ICD-10-CM

## 2017-07-02 MED ORDER — THYROID 30 MG PO TABS: 30.0000 mg | ORAL_TABLET | Freq: Every day | ORAL | 0 refills | Status: DC

## 2017-07-02 NOTE — Telephone Encounter (Signed)
Pt contacted office for refill request on the following medications:  ARMOUR THYROID 30 MG tablet  [facility]  Pt had requested this medication on 06/30/17 but was not refilled due to needing OV. Pt stated that she wasn't advised she would need an OV and she is out of the medication and has to leave tomorrow to got out of town. Pt is scheduled for OV on 07/08/17 and is requesting for another provider to approve refill for 30 day supply since [name] is out of the office today. Please advise. Thanks [name]

## 2017-07-08 ENCOUNTER — Encounter: Payer: Self-pay | Admitting: Family Medicine

## 2017-07-08 ENCOUNTER — Ambulatory Visit: Payer: BLUE CROSS/BLUE SHIELD | Admitting: Family Medicine

## 2017-07-08 VITALS — BP 114/70 | HR 70 | Temp 98.6°F | Ht 65.0 in | Wt 166.0 lb

## 2017-07-08 DIAGNOSIS — Z1231 Encounter for screening mammogram for malignant neoplasm of breast: Secondary | ICD-10-CM | POA: Diagnosis not present

## 2017-07-08 DIAGNOSIS — G5600 Carpal tunnel syndrome, unspecified upper limb: Secondary | ICD-10-CM | POA: Insufficient documentation

## 2017-07-08 DIAGNOSIS — E039 Hypothyroidism, unspecified: Secondary | ICD-10-CM | POA: Diagnosis not present

## 2017-07-08 DIAGNOSIS — Z23 Encounter for immunization: Secondary | ICD-10-CM

## 2017-07-08 DIAGNOSIS — M546 Pain in thoracic spine: Secondary | ICD-10-CM | POA: Insufficient documentation

## 2017-07-08 DIAGNOSIS — I10 Essential (primary) hypertension: Secondary | ICD-10-CM

## 2017-07-08 DIAGNOSIS — M5030 Other cervical disc degeneration, unspecified cervical region: Secondary | ICD-10-CM | POA: Insufficient documentation

## 2017-07-08 DIAGNOSIS — M797 Fibromyalgia: Secondary | ICD-10-CM | POA: Insufficient documentation

## 2017-07-08 DIAGNOSIS — Z1239 Encounter for other screening for malignant neoplasm of breast: Secondary | ICD-10-CM

## 2017-07-08 NOTE — Progress Notes (Signed)
Patient: Tami Finley Female    DOB: 04/08/1966   52 y.o.   MRN: [id number] Visit Date: 07/08/2017  Today's Provider: Vernie Murders, PA   Chief Complaint  Patient presents with  . Hypothyroidism  . Hypertension  . Follow-up   Subjective:    HPI  Hypothyroid, follow-up:  [facility]   Lab Results  Component Value Date   TSH 0.60 12/09/2013    Wt Readings from Last 3 Encounters:  07/08/17 166 lb (75.3 kg)  03/25/16 160 lb 3.2 oz (72.7 kg)  03/01/15 156 lb (70.8 kg)     She was last seen for hypothyroid 1 years ago.  Management since that visit includes continue medication. She reports excellent compliance with treatment. She is not having side effects.  She is exercising. She is experiencing night sweats and cold intolerance  Weight trend: stable  ------------------------------------------------------------------------  Hypertension, follow-up:     BP Readings from Last 3 Encounters:  03/25/16 128/78  03/01/15 134/82  12/06/13 124/82    She was last seen for hypertension 1 years ago.  BP at that visit was 128/78. Management changes since that visit include continue medication. She reports excellent compliance with treatment. She is not having side effects.  She is exercising. She is adherent to low salt diet.   Outside blood pressures are not being checked. She is experiencing none.  Patient denies chest pain, irregular heart beat and palpitations.   Cardiovascular risk factors include none.  Use of agents associated with hypertension: none.                Weight trend: stable    Wt Readings from Last 3 Encounters:  03/25/16 160 lb 3.2 oz (72.7 kg)  03/01/15 156 lb (70.8 kg)  12/06/13 150 lb (68 kg)    Current diet: in general, a "healthy" diet    ------------------------------------------------------------------------ Past Medical History:  Diagnosis Date  . Allergy   . Anxiety   . Hyperlipidemia   . Hypertension   .  Thyroid disease    Past Surgical History:  Procedure Laterality Date  . CHOLECYSTECTOMY  06/04/2007   Family History  Problem Relation Age of Onset  . Hypertension Mother   . Hyperlipidemia Mother   . Thyroid disease Mother   . Hypertension Father   . Hyperlipidemia Father   . Diabetes Sister   . Hypertension Sister   . Thyroid disease Sister   . Thyroid disease Maternal Grandmother    Allergies  Allergen Reactions  . Other     Other reaction(s): Headache, Other  . Penicillins Rash    Current Outpatient Medications:  .  hydrochlorothiazide (HYDRODIURIL) 12.5 MG tablet, TAKE 1 TABLET DAILY, Disp: 90 tablet, Rfl: 0 .  HYDROcodone-acetaminophen (NORCO/VICODIN) 5-325 MG tablet, Take 1 tablet by mouth every 6 (six) hours as needed for moderate pain., Disp: 90 tablet, Rfl: 0 .  metoprolol succinate (TOPROL-XL) 25 MG 24 hr tablet, Take 1 tablet (25 mg total) by mouth daily., Disp: 90 tablet, Rfl: 3 .  Probiotic CAPS, Take 1 capsule by mouth daily., Disp: , Rfl:  .  thyroid (ARMOUR THYROID) 30 MG tablet, Take 1 tablet (30 mg total) by mouth daily., Disp: 30 tablet, Rfl: 0  Review of Systems  Constitutional: Negative.   Respiratory: Negative.   Cardiovascular: Negative.    Social History   Tobacco Use  . Smoking status: Never Smoker  . Smokeless tobacco: Never Used  Substance Use Topics  . Alcohol use:  Yes   Objective:   BP 114/70 (BP Location: Right Arm, Patient Position: Sitting, Cuff Size: Normal)   Pulse 70   Temp 98.6 F (37 C) (Oral)   Ht 5\' 5"  (1.651 m)   Wt 166 lb (75.3 kg)   SpO2 98%   BMI 27.62 kg/m   Physical Exam     Assessment & Plan:     1. Hypothyroidism, unspecified type Stable TSH and Free T4 on labs done by Dr. Alroy Dust (rheumatologist) November 2018. Still taking Armour Thyroid 30 mg qd. Has gained 6 lbs since November 2018. During that time, she had been on different rheumatoid disease medications (Enbrel, Plaquenil, etc. - follow up next month  to consider methotrexate) that may have helped her gain some weight. Recommend she exercise and lower caloric intake as the polyarthralgia allows. Recheck in 6 months.  2. Essential hypertension Continues to take Metoprolol Succinate 25 mg qd and HCTZ 12.5 mg qd without side effects. Labs at rheumatologist's office showed normal liver and kidney functions. Normal electrolytes and CBC. Continue present dosages and recheck in 6 months. BP well controlled.  3. Screening for breast cancer Asymptomatic without masses on BSE. Gets PAP and GYN exam at [facility] annually. - MM Digital Screening  4. Needs flu shot - Flu Vaccine QUAD 6+ mos PF IM (Fluarix Quad PF)  5. Need for diphtheria-tetanus-pertussis (Tdap) vaccine - Tdap vaccine greater than or equal to 7yo IM       Vernie Murders, PA  [facility]

## 2017-07-15 DIAGNOSIS — M545 Low back pain: Secondary | ICD-10-CM | POA: Diagnosis not present

## 2017-07-15 DIAGNOSIS — L4050 Arthropathic psoriasis, unspecified: Secondary | ICD-10-CM | POA: Diagnosis not present

## 2017-07-15 DIAGNOSIS — E559 Vitamin D deficiency, unspecified: Secondary | ICD-10-CM | POA: Diagnosis not present

## 2017-07-15 DIAGNOSIS — Z79899 Other long term (current) drug therapy: Secondary | ICD-10-CM | POA: Diagnosis not present

## 2017-07-30 ENCOUNTER — Other Ambulatory Visit: Payer: Self-pay | Admitting: Family Medicine

## 2017-07-30 DIAGNOSIS — E039 Hypothyroidism, unspecified: Secondary | ICD-10-CM

## 2017-07-30 NOTE — Telephone Encounter (Signed)
Tami Finley is not in office today, could you refill? Last TSH was checked in November, and was normal.

## 2017-07-30 NOTE — Telephone Encounter (Signed)
Pt contacted office for refill request on the following medications:  1. thyroid (ARMOUR THYROID) 30 MG tablet 2. metoprolol succinate (TOPROL-XL) 25 MG 24 hr tablet 3. hydrochlorothiazide (HYDRODIURIL) 12.5 MG tablet   90 day supply  [facility]  Pt stated that she was advised at her OV with [name] on 07/08/17 that all 3 of the medications would be sent to [facility]. Pt stated that was the reason for her visit. Pt stated that she is out of all the medication and is requesting that another provider send in the refills today since [name] is out of the office today. Please advise. Thanks [name]

## 2017-07-31 ENCOUNTER — Telehealth: Payer: Self-pay | Admitting: Family Medicine

## 2017-07-31 ENCOUNTER — Other Ambulatory Visit: Payer: Self-pay | Admitting: Family Medicine

## 2017-07-31 DIAGNOSIS — I10 Essential (primary) hypertension: Secondary | ICD-10-CM

## 2017-07-31 DIAGNOSIS — E039 Hypothyroidism, unspecified: Secondary | ICD-10-CM

## 2017-07-31 MED ORDER — HYDROCHLOROTHIAZIDE 12.5 MG PO TABS: 12.5000 mg | ORAL_TABLET | Freq: Every day | ORAL | 3 refills | Status: DC

## 2017-07-31 MED ORDER — METOPROLOL SUCCINATE ER 25 MG PO TB24: 25.0000 mg | ORAL_TABLET | Freq: Every day | ORAL | 0 refills | Status: DC

## 2017-07-31 MED ORDER — THYROID 30 MG PO TABS: ORAL_TABLET | ORAL | 3 refills | Status: DC

## 2017-07-31 MED ORDER — METOPROLOL SUCCINATE ER 25 MG PO TB24: 25.0000 mg | ORAL_TABLET | Freq: Every day | ORAL | 3 refills | Status: DC

## 2017-07-31 NOTE — Telephone Encounter (Signed)
I'm sorry I overlooked this. Sent the refills for a year on each to the [facility]. Does she need any locally until this mail order comes to her?

## 2017-07-31 NOTE — Telephone Encounter (Signed)
Note    Pt contacted office for refill request on the following medications:  1. thyroid (ARMOUR THYROID) 30 MG tablet 2. metoprolol succinate (TOPROL-XL) 25 MG 24 hr tablet 3. hydrochlorothiazide (HYDRODIURIL) 12.5 MG tablet   90 day supply  [facility]  Pt stated that she was advised at her OV with [name] on 07/08/17 that all 3 of the medications would be sent to [facility]. Pt stated that was the reason for her visit. Pt stated that she is out of all the medications.     The Armour thyroid was [facility] but she also needs a 30 day supply of Metoprolol called to [facility] [address] [address] [facility] until [facility] can send her 90 days worth.

## 2017-07-31 NOTE — Telephone Encounter (Signed)
Done

## 2017-07-31 NOTE — Telephone Encounter (Signed)
Patient stated she is out of metoprolol and would like 30 days supply sent to [facility].

## 2017-08-01 NOTE — Telephone Encounter (Signed)
Patient was advised.  

## 2017-08-06 ENCOUNTER — Ambulatory Visit
Admission: RE | Admit: 2017-08-06 | Discharge: 2017-08-06 | Disposition: A | Discharge status: Home / Self Care | Payer: BLUE CROSS/BLUE SHIELD | Source: Ambulatory Visit | Attending: Family Medicine | Admitting: Family Medicine

## 2017-08-06 DIAGNOSIS — Z1231 Encounter for screening mammogram for malignant neoplasm of breast: Secondary | ICD-10-CM | POA: Diagnosis not present

## 2017-08-14 ENCOUNTER — Inpatient Hospital Stay
Admission: RE | Admit: 2017-08-14 | Discharge: 2017-08-14 | Disposition: A | Discharge status: Home / Self Care | Payer: Self-pay | Source: Ambulatory Visit | Attending: *Deleted | Admitting: *Deleted

## 2017-08-14 ENCOUNTER — Other Ambulatory Visit: Payer: Self-pay | Admitting: *Deleted

## 2017-08-14 DIAGNOSIS — Z9289 Personal history of other medical treatment: Secondary | ICD-10-CM

## 2017-08-21 DIAGNOSIS — M545 Low back pain: Secondary | ICD-10-CM | POA: Diagnosis not present

## 2017-08-21 DIAGNOSIS — M5136 Other intervertebral disc degeneration, lumbar region: Secondary | ICD-10-CM | POA: Diagnosis not present

## 2017-08-21 DIAGNOSIS — M546 Pain in thoracic spine: Secondary | ICD-10-CM | POA: Diagnosis not present

## 2017-08-21 DIAGNOSIS — Z79891 Long term (current) use of opiate analgesic: Secondary | ICD-10-CM | POA: Diagnosis not present

## 2017-08-26 ENCOUNTER — Other Ambulatory Visit: Payer: Self-pay | Admitting: Family Medicine

## 2017-08-26 DIAGNOSIS — E039 Hypothyroidism, unspecified: Secondary | ICD-10-CM

## 2017-09-03 DIAGNOSIS — Z79899 Other long term (current) drug therapy: Secondary | ICD-10-CM | POA: Diagnosis not present

## 2017-09-03 DIAGNOSIS — M450 Ankylosing spondylitis of multiple sites in spine: Secondary | ICD-10-CM | POA: Diagnosis not present

## 2017-09-03 DIAGNOSIS — R768 Other specified abnormal immunological findings in serum: Secondary | ICD-10-CM | POA: Diagnosis not present

## 2017-09-03 DIAGNOSIS — R748 Abnormal levels of other serum enzymes: Secondary | ICD-10-CM | POA: Diagnosis not present

## 2017-09-03 DIAGNOSIS — R5383 Other fatigue: Secondary | ICD-10-CM | POA: Diagnosis not present

## 2017-11-13 DIAGNOSIS — M5136 Other intervertebral disc degeneration, lumbar region: Secondary | ICD-10-CM | POA: Diagnosis not present

## 2017-11-13 DIAGNOSIS — M5030 Other cervical disc degeneration, unspecified cervical region: Secondary | ICD-10-CM | POA: Diagnosis not present

## 2017-11-13 DIAGNOSIS — M546 Pain in thoracic spine: Secondary | ICD-10-CM | POA: Diagnosis not present

## 2017-11-13 DIAGNOSIS — R748 Abnormal levels of other serum enzymes: Secondary | ICD-10-CM | POA: Diagnosis not present

## 2017-11-13 DIAGNOSIS — M545 Low back pain: Secondary | ICD-10-CM | POA: Diagnosis not present

## 2017-11-18 ENCOUNTER — Other Ambulatory Visit: Payer: Self-pay | Admitting: Internal Medicine

## 2017-11-18 DIAGNOSIS — M898X9 Other specified disorders of bone, unspecified site: Secondary | ICD-10-CM

## 2017-11-21 ENCOUNTER — Other Ambulatory Visit: Payer: Self-pay | Admitting: Internal Medicine

## 2017-11-21 DIAGNOSIS — M898X9 Other specified disorders of bone, unspecified site: Secondary | ICD-10-CM

## 2017-11-24 ENCOUNTER — Encounter
Admission: RE | Admit: 2017-11-24 | Discharge: 2017-11-24 | Disposition: A | Discharge status: Home / Self Care | Payer: BLUE CROSS/BLUE SHIELD | Source: Ambulatory Visit | Attending: Internal Medicine | Admitting: Internal Medicine

## 2017-11-24 DIAGNOSIS — M898X9 Other specified disorders of bone, unspecified site: Secondary | ICD-10-CM | POA: Diagnosis not present

## 2017-11-24 DIAGNOSIS — M8580 Other specified disorders of bone density and structure, unspecified site: Secondary | ICD-10-CM | POA: Diagnosis not present

## 2017-11-24 MED ORDER — TECHNETIUM TC 99M MEDRONATE IV KIT
20.0000 | PACK | Freq: Once | INTRAVENOUS | Status: AC | PRN
  Administered 2017-11-24: 23.9 via INTRAVENOUS

## 2017-12-24 DIAGNOSIS — M450 Ankylosing spondylitis of multiple sites in spine: Secondary | ICD-10-CM | POA: Diagnosis not present

## 2017-12-24 DIAGNOSIS — R5383 Other fatigue: Secondary | ICD-10-CM | POA: Diagnosis not present

## 2017-12-24 DIAGNOSIS — Z79899 Other long term (current) drug therapy: Secondary | ICD-10-CM | POA: Diagnosis not present

## 2018-02-09 DIAGNOSIS — M546 Pain in thoracic spine: Secondary | ICD-10-CM | POA: Diagnosis not present

## 2018-02-09 DIAGNOSIS — M545 Low back pain: Secondary | ICD-10-CM | POA: Diagnosis not present

## 2018-02-09 DIAGNOSIS — M5136 Other intervertebral disc degeneration, lumbar region: Secondary | ICD-10-CM | POA: Diagnosis not present

## 2018-04-01 DIAGNOSIS — Z79899 Other long term (current) drug therapy: Secondary | ICD-10-CM | POA: Diagnosis not present

## 2018-04-01 DIAGNOSIS — M450 Ankylosing spondylitis of multiple sites in spine: Secondary | ICD-10-CM | POA: Diagnosis not present

## 2018-04-01 DIAGNOSIS — R5383 Other fatigue: Secondary | ICD-10-CM | POA: Diagnosis not present

## 2018-04-01 DIAGNOSIS — M25521 Pain in right elbow: Secondary | ICD-10-CM | POA: Diagnosis not present

## 2018-04-01 LAB — CBC WITH DIFFERENTIAL/PLATELET
BASO(ABSOLUTE): 0
Basophil: 0
EOS (ABSOLUTE): 0
Eosinophil: 1
Lymphs Abs: 1.9
MCH: 30.5 pg (ref 26–32)
MCHC: 34.4 g/dL (ref 32–36.5)
MCV: 89 fL (ref 76.0–111.0)
Monocyes absolute: 0.4 10*3/uL (ref 0.1–1)
Monocytes: 8
Neutrophils: 56
RBC: 4.06 MIL/uL (ref 3.8–5.1)
RDW: 12.4 % (ref 11.6–14)
lymph#: 35

## 2018-04-01 LAB — CBC AND DIFFERENTIAL
HCT: 36 (ref 36–46)
Hemoglobin: 12.4 (ref 12.0–16.0)
NEUTROS ABS: 3
Platelets: 306 (ref 150–399)
WBC: 5.4

## 2018-04-01 LAB — COMPREHENSIVE METABOLIC PANEL
Albumin/Globulin Ratio: 1.9
Albumin: 4.7
BUN/Creatinine Ratio: 18
Calcium: 9.8
Carbon Dioxide, Total: 23
Chloride: 100
EGFR (Non-African Amer.): 100
GFR CALC AF AMER: 115
Globulin, Total: 2.5
Total Bilirubin: 0.2
Total Protein: 7.2 g/dL

## 2018-04-01 LAB — BASIC METABOLIC PANEL
BUN: 13 (ref 4–21)
Creatinine: 0.7 (ref 0.5–1.1)
Glucose: 97
Potassium: 4.1 (ref 3.4–5.3)
Sodium: 140 (ref 137–147)

## 2018-04-01 LAB — TSH: TSH: 1.16 (ref 0.41–5.90)

## 2018-04-01 LAB — HEPATIC FUNCTION PANEL
ALT: 29 (ref 7–35)
AST: 23 (ref 13–35)
Alkaline Phosphatase: 141 — AB (ref 25–125)

## 2018-04-01 LAB — TSH+FREE T4: T4,Free (Direct): 1

## 2018-04-29 DIAGNOSIS — G5623 Lesion of ulnar nerve, bilateral upper limbs: Secondary | ICD-10-CM | POA: Diagnosis not present

## 2018-04-29 DIAGNOSIS — M7711 Lateral epicondylitis, right elbow: Secondary | ICD-10-CM | POA: Diagnosis not present

## 2018-04-29 DIAGNOSIS — M19031 Primary osteoarthritis, right wrist: Secondary | ICD-10-CM | POA: Diagnosis not present

## 2018-05-07 DIAGNOSIS — M545 Low back pain: Secondary | ICD-10-CM | POA: Diagnosis not present

## 2018-05-07 DIAGNOSIS — M546 Pain in thoracic spine: Secondary | ICD-10-CM | POA: Diagnosis not present

## 2018-05-07 DIAGNOSIS — M542 Cervicalgia: Secondary | ICD-10-CM | POA: Diagnosis not present

## 2018-05-07 DIAGNOSIS — Z79899 Other long term (current) drug therapy: Secondary | ICD-10-CM | POA: Diagnosis not present

## 2018-05-07 DIAGNOSIS — M5136 Other intervertebral disc degeneration, lumbar region: Secondary | ICD-10-CM | POA: Diagnosis not present

## 2018-05-07 DIAGNOSIS — Z5181 Encounter for therapeutic drug level monitoring: Secondary | ICD-10-CM | POA: Diagnosis not present

## 2018-06-24 DIAGNOSIS — R5383 Other fatigue: Secondary | ICD-10-CM | POA: Diagnosis not present

## 2018-06-24 DIAGNOSIS — L4050 Arthropathic psoriasis, unspecified: Secondary | ICD-10-CM | POA: Diagnosis not present

## 2018-06-24 DIAGNOSIS — Z79899 Other long term (current) drug therapy: Secondary | ICD-10-CM | POA: Diagnosis not present

## 2018-06-27 ENCOUNTER — Other Ambulatory Visit: Payer: Self-pay | Admitting: Family Medicine

## 2018-06-27 DIAGNOSIS — E039 Hypothyroidism, unspecified: Secondary | ICD-10-CM

## 2018-06-27 DIAGNOSIS — I10 Essential (primary) hypertension: Secondary | ICD-10-CM

## 2018-06-29 NOTE — Progress Notes (Signed)
Patient: Tami Finley, Female    DOB: Nov 26, 1965, 53 y.o.   MRN: [id number] Visit Date: 07/02/2018  Today's Provider: Vernie Murders, PA   Chief Complaint  Patient presents with  . Annual Exam   Subjective:     Annual physical exam Tami Finley is a 53 y.o. female who presents today for health maintenance and complete physical. She feels fairly well. She reports exercising some. She reports she is sleeping fairly well. GYN follow up and screening at [facility]. ---------------------------------------------------------------    Review of Systems  Constitutional: Positive for diaphoresis and fatigue.  HENT: Positive for congestion.   Gastrointestinal: Positive for abdominal pain.  Endocrine: Positive for cold intolerance and heat intolerance.  Musculoskeletal: Positive for arthralgias, back pain, joint swelling, neck pain and neck stiffness.  Neurological: Positive for headaches.  Psychiatric/Behavioral: Positive for sleep disturbance.  All other systems reviewed and are negative.   Social History      She  reports that she has never smoked. She has never used smokeless tobacco. She reports current alcohol use. She reports that she does not use drugs.       Social History   Socioeconomic History  . Marital status: Married    Spouse name: Not on file  . Number of children: 2  . Years of education: H/S  . Highest education level: Not on file  Occupational History  . Occupation: Full-Time  Social Needs  . Financial resource strain: Not on file  . Food insecurity:    Worry: Not on file    Inability: Not on file  . Transportation needs:    Medical: Not on file    Non-medical: Not on file  Tobacco Use  . Smoking status: Never Smoker  . Smokeless tobacco: Never Used  Substance and Sexual Activity  . Alcohol use: Yes  . Drug use: No  . Sexual activity: Not on file  Lifestyle  . Physical activity:    Days per week: Not on file    Minutes per session:  Not on file  . Stress: Not on file  Relationships  . Social connections:    Talks on phone: Not on file    Gets together: Not on file    Attends religious service: Not on file    Active member of club or organization: Not on file    Attends meetings of clubs or organizations: Not on file    Relationship status: Not on file  Other Topics Concern  . Not on file  Social History Narrative  . Not on file   Past Medical History:  Diagnosis Date  . Allergy   . Anxiety   . Hyperlipidemia   . Hypertension   . Thyroid disease    Patient Active Problem List   Diagnosis Date Noted  . Carpal tunnel syndrome 07/08/2017  . DDD (degenerative disc disease), cervical 07/08/2017  . Thoracic back pain 07/08/2017  . 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 Allergic rhinitis 03/01/2015  . Absolute anemia 03/01/2015  . Anxiety 03/01/2015  . Arthritis 03/01/2015  . Body mass index (BMI) of 24.0-24.9 in adult 03/01/2015  . Abnormal C-reactive protein 03/01/2015  . Elevated lipase 03/01/2015  . Abnormal liver enzymes 03/01/2015  . Fibrositis 03/01/2015  . BP (high blood pressure) 03/01/2015  . Adaptive colitis 03/01/2015  . Ache in joint 03/01/2015  . Hypoglycemia 03/01/2015  . Abdominal pain, lower 03/01/2015  . Headache, migraine 03/01/2015  . IBS (irritable bowel syndrome) 03/01/2015  .  Flu vaccine need 03/01/2015  . Fibromyalgia 03/01/2015  . Hypertension 12/12/2014  . Hypothyroidism 11/28/2014   Past Surgical History:  Procedure Laterality Date  . CHOLECYSTECTOMY  06/04/2007   Family History        Family Status  Relation Name Status  . Mother  Alive  . Father  Alive  . Sister  Alive  . MGM  (Not Specified)  . Other m gr aunt Alive        Her family history includes Breast cancer in an other family member; Diabetes in her sister; Hyperlipidemia in her father and mother; Hypertension in her father, mother, and sister; Thyroid disease in her maternal grandmother, mother, and sister.       Allergies  Allergen Reactions  . Other     Other reaction(s): Headache, Other  . Penicillins Rash    Current Outpatient Medications:  .  Buprenorphine 15 MCG/HR PTWK, APPLY 1 PATCH ONTO THE SKIN ONCE A WEEK. CHANGE OLD PATCH BEFORE APPLYING NEW PATCH, Disp: , Rfl:  .  hydrochlorothiazide (HYDRODIURIL) 12.5 MG tablet, Take 1 tablet (12.5 mg total) by mouth daily. Need follow up appointment and labs in the next month., Disp: 30 tablet, Rfl: 0 .  metoprolol succinate (TOPROL-XL) 25 MG 24 hr tablet, Take 1 tablet (25 mg total) by mouth daily., Disp: 30 tablet, Rfl: 0 .  oxyCODONE-acetaminophen (PERCOCET/ROXICET) 5-325 MG tablet, , Disp: , Rfl:  .  thyroid (ARMOUR THYROID) 30 MG tablet, TAKE 1 TABLET DAILY, Disp: 30 tablet, Rfl: 0 .  HYDROcodone-acetaminophen (NORCO/VICODIN) 5-325 MG tablet, Take 1 tablet by mouth every 6 (six) hours as needed for moderate pain. (Patient not taking: Reported on 07/02/2018), Disp: 90 tablet, Rfl: 0 .  Probiotic CAPS, Take 1 capsule by mouth daily., Disp: , Rfl:    Patient Care Team: Atheena Spano, Vickki Muff, PA as PCP - General (Family Medicine)    Objective:    Vitals: LMP 05/21/2003 Comment: IUD removed 2017 by GYN.  LMP 12 yrs ago. Body mass index is 27.12 kg/m.  Vitals:   07/02/18 1340  BP: 120/78  Pulse: 89  Resp: 16  Temp: 98.2 F (36.8 C)  TempSrc: Oral  SpO2: 97%  Weight: 163 lb (73.9 kg)  Height: '5\' 5"'$  (1.651 m)    Physical Exam Constitutional:      General: She is not in acute distress.    Appearance: She is well-developed.  HENT:     Head: Normocephalic and atraumatic.     Right Ear: Hearing and external ear normal.     Left Ear: Hearing and external ear normal.     Nose: Nose normal.  Eyes:     General: Lids are normal. No scleral icterus.       Right eye: No discharge.        Left eye: No discharge.     Conjunctiva/sclera: Conjunctivae normal.     Pupils: Pupils are equal, round, and reactive to light.  Neck:      Musculoskeletal: Normal range of motion and neck supple.     Thyroid: No thyromegaly.     Trachea: No tracheal deviation.  Cardiovascular:     Rate and Rhythm: Normal rate and regular rhythm.     Heart sounds: Normal heart sounds. No murmur.  Pulmonary:     Effort: Pulmonary effort is normal. No respiratory distress.     Breath sounds: Normal breath sounds. No wheezing or rales.  Chest:     Chest wall: No tenderness.  Abdominal:  General: There is no distension.     Palpations: Abdomen is soft. There is no mass.     Tenderness: There is no abdominal tenderness. There is no guarding or rebound.  Genitourinary:    Comments: Evaluation and follow up with GYN annually. Musculoskeletal: Normal range of motion.        General: No tenderness.  Lymphadenopathy:     Cervical: No cervical adenopathy.  Skin:    General: Skin is warm and dry.     Findings: No erythema, lesion or rash.  Neurological:     Mental Status: She is alert and oriented to person, place, and time.     Cranial Nerves: No cranial nerve deficit.     Motor: No abnormal muscle tone.     Coordination: Coordination normal.     Deep Tendon Reflexes: Reflexes are normal and symmetric. Reflexes normal.  Psychiatric:        Speech: Speech normal.        Behavior: Behavior normal.        Thought Content: Thought content normal.        Judgment: Judgment normal.    Depression Screen PHQ 2/9 Scores 07/02/2018 07/08/2017  PHQ - 2 Score 0 0  PHQ- 9 Score 3 6      Assessment & Plan:     Routine Health Maintenance and Physical Exam  Exercise Activities and Dietary recommendations Goals   Trying to walk for exercise and controlling caloric/fat intake.     Immunization History  Administered Date(s) Administered  . Influenza,inj,Quad PF,6+ Mos 03/01/2015, 03/25/2016, 07/08/2017  . PPD Test 04/10/2017  . Tdap 07/08/2017    Health Maintenance  Topic Date Due  . HIV Screening  06/25/1982  . PAP SMEAR-Modifier   06/25/1988  . INFLUENZA VACCINE  12/18/2017  . MAMMOGRAM  08/07/2019  . COLONOSCOPY  12/30/2022  . TETANUS/TDAP  07/09/2027    Discussed health benefits of physical activity, and encouraged her to engage in regular exercise appropriate for her age and condition.    -------------------------------------------------------------------- 1. Encounter for annual physical examination excluding gynecological examination in a patient older than 17 years General health stable and well controlled diet/weight. Immunizations up to date. Last colonoscopy, PAP and mammogram reports were from 2014. Gets GYN exam, mammograms, etc per [facility] regularly. Recheck Lipid Panel at the request of her rheumatologist. - Lipid panel  2. Essential hypertension Well controlled BP and tolerating HCTZ 12.5 mg qd and Metoprolol Succinate 25 mg qd. Creatinine 0.71, BUN 13, EGFR 100, Na+ 140, K+4.1, ALT 29, AST 23  And total protein 7.2 from labs done on 04-01-18. Continue present regimen with restricted caffeine and salt intake. Denies any ETOH use with chronic narcotic analgesics. Recheck Lipid panel as rheumatologist requested before starting Otezla. - hydrochlorothiazide (HYDRODIURIL) 12.5 MG tablet; Take 1 tablet (12.5 mg total) by mouth daily.  Dispense: 90 tablet; Refill: 3 - metoprolol succinate (TOPROL-XL) 25 MG 24 hr tablet; Take 1 tablet (25 mg total) by mouth daily.  Dispense: 90 tablet; Refill: 3 - Lipid panel  3. Hypothyroidism, unspecified type Continues to take Armour Thyroid 30 mg qd. Labs on 04-01-18 showed TSH 1.160, Free T4 1.00 WBC 5,400, Hgb 12.4, Hct 36.0 and platelets 306,000. Recheck lipid panel. No tremor, significant weight changes or change in tolerance to heat or cold..  - Lipid panel  4. Fibromyositis Continues to have low back, multiple joints aching and fatigue with history of anklylosing spondylitis. Continues follow up with rheumatologist, orthopedist and pain  management. Presently  using Buprenorphine 15 mcg/HR patch once a week and Percocet 5-325 mg one tab qd. Has tried Enbrel and Humira for psoriatic arthritis without relief. In the process of submitting paperwork for trial of Otezla 30 mg BID.    Vernie Murders, PA  [facility]

## 2018-06-30 DIAGNOSIS — D485 Neoplasm of uncertain behavior of skin: Secondary | ICD-10-CM | POA: Diagnosis not present

## 2018-06-30 DIAGNOSIS — L820 Inflamed seborrheic keratosis: Secondary | ICD-10-CM | POA: Diagnosis not present

## 2018-06-30 DIAGNOSIS — D229 Melanocytic nevi, unspecified: Secondary | ICD-10-CM | POA: Diagnosis not present

## 2018-06-30 DIAGNOSIS — L812 Freckles: Secondary | ICD-10-CM | POA: Diagnosis not present

## 2018-06-30 DIAGNOSIS — Z1283 Encounter for screening for malignant neoplasm of skin: Secondary | ICD-10-CM | POA: Diagnosis not present

## 2018-07-02 ENCOUNTER — Encounter: Payer: Self-pay | Admitting: Family Medicine

## 2018-07-02 ENCOUNTER — Ambulatory Visit (INDEPENDENT_AMBULATORY_CARE_PROVIDER_SITE_OTHER): Payer: BLUE CROSS/BLUE SHIELD | Admitting: Family Medicine

## 2018-07-02 VITALS — BP 120/78 | HR 89 | Temp 98.2°F | Resp 16 | Ht 65.0 in | Wt 163.0 lb

## 2018-07-02 DIAGNOSIS — E039 Hypothyroidism, unspecified: Secondary | ICD-10-CM

## 2018-07-02 DIAGNOSIS — Z0000 Encounter for general adult medical examination without abnormal findings: Secondary | ICD-10-CM

## 2018-07-02 DIAGNOSIS — I10 Essential (primary) hypertension: Secondary | ICD-10-CM

## 2018-07-02 DIAGNOSIS — M797 Fibromyalgia: Secondary | ICD-10-CM | POA: Diagnosis not present

## 2018-07-02 MED ORDER — HYDROCHLOROTHIAZIDE 12.5 MG PO TABS: 12.5000 mg | ORAL_TABLET | Freq: Every day | ORAL | 3 refills | Status: DC

## 2018-07-02 MED ORDER — METOPROLOL SUCCINATE ER 25 MG PO TB24: 25.0000 mg | ORAL_TABLET | Freq: Every day | ORAL | 3 refills | Status: DC

## 2018-07-03 ENCOUNTER — Encounter: Payer: Self-pay | Admitting: *Deleted

## 2018-07-08 DIAGNOSIS — Z0000 Encounter for general adult medical examination without abnormal findings: Secondary | ICD-10-CM | POA: Diagnosis not present

## 2018-07-08 DIAGNOSIS — E039 Hypothyroidism, unspecified: Secondary | ICD-10-CM | POA: Diagnosis not present

## 2018-07-08 DIAGNOSIS — I10 Essential (primary) hypertension: Secondary | ICD-10-CM | POA: Diagnosis not present

## 2018-07-09 ENCOUNTER — Other Ambulatory Visit: Payer: Self-pay | Admitting: *Deleted

## 2018-07-09 DIAGNOSIS — I10 Essential (primary) hypertension: Secondary | ICD-10-CM

## 2018-07-09 LAB — LIPID PANEL
Chol/HDL Ratio: 4 ratio (ref 0.0–4.4)
Cholesterol, Total: 199 mg/dL (ref 100–199)
HDL: 50 mg/dL (ref >39)
LDL Calculated: 120 mg/dL — ABNORMAL HIGH (ref 0–99)
Triglycerides: 147 mg/dL (ref 0–149)
VLDL Cholesterol Cal: 29 mg/dL (ref 5–40)

## 2018-07-09 MED ORDER — METOPROLOL SUCCINATE ER 25 MG PO TB24: 25.0000 mg | ORAL_TABLET | Freq: Every day | ORAL | 3 refills | Status: DC

## 2018-07-09 NOTE — Telephone Encounter (Signed)
Patient states metoprolol was [facility].

## 2018-07-28 ENCOUNTER — Other Ambulatory Visit: Payer: Self-pay | Admitting: Family Medicine

## 2018-07-28 DIAGNOSIS — I10 Essential (primary) hypertension: Secondary | ICD-10-CM

## 2018-07-28 DIAGNOSIS — E039 Hypothyroidism, unspecified: Secondary | ICD-10-CM

## 2018-07-28 MED ORDER — HYDROCHLOROTHIAZIDE 12.5 MG PO TABS: 12.5000 mg | ORAL_TABLET | Freq: Every day | ORAL | 3 refills | Status: DC

## 2018-07-28 NOTE — Telephone Encounter (Signed)
Pt's Rx - hydrochlorothiazide (HYDRODIURIL) 12.5 MG tablet Was called into [facility].    Please call Rx into:  [place], [place] - [address] [phone] (Phone) [phone] (Fax)   Thanks, [place]

## 2018-08-06 DIAGNOSIS — M546 Pain in thoracic spine: Secondary | ICD-10-CM | POA: Diagnosis not present

## 2018-08-06 DIAGNOSIS — M5136 Other intervertebral disc degeneration, lumbar region: Secondary | ICD-10-CM | POA: Diagnosis not present

## 2018-08-06 DIAGNOSIS — M545 Low back pain: Secondary | ICD-10-CM | POA: Diagnosis not present

## 2018-09-22 DIAGNOSIS — L4050 Arthropathic psoriasis, unspecified: Secondary | ICD-10-CM | POA: Diagnosis not present

## 2018-09-22 DIAGNOSIS — M79671 Pain in right foot: Secondary | ICD-10-CM | POA: Diagnosis not present

## 2018-09-22 DIAGNOSIS — M79641 Pain in right hand: Secondary | ICD-10-CM | POA: Diagnosis not present

## 2018-10-05 ENCOUNTER — Other Ambulatory Visit: Payer: Self-pay | Admitting: Family Medicine

## 2018-10-05 DIAGNOSIS — Z1231 Encounter for screening mammogram for malignant neoplasm of breast: Secondary | ICD-10-CM

## 2018-10-13 DIAGNOSIS — D2271 Melanocytic nevi of right lower limb, including hip: Secondary | ICD-10-CM | POA: Diagnosis not present

## 2018-10-13 DIAGNOSIS — D485 Neoplasm of uncertain behavior of skin: Secondary | ICD-10-CM | POA: Diagnosis not present

## 2018-10-13 DIAGNOSIS — Z86018 Personal history of other benign neoplasm: Secondary | ICD-10-CM

## 2018-10-13 DIAGNOSIS — L820 Inflamed seborrheic keratosis: Secondary | ICD-10-CM | POA: Diagnosis not present

## 2018-10-13 HISTORY — DX: Personal history of other benign neoplasm: Z86.018

## 2018-10-14 DIAGNOSIS — J309 Allergic rhinitis, unspecified: Secondary | ICD-10-CM | POA: Diagnosis not present

## 2018-10-14 DIAGNOSIS — J3489 Other specified disorders of nose and nasal sinuses: Secondary | ICD-10-CM | POA: Diagnosis not present

## 2018-11-05 DIAGNOSIS — Z5181 Encounter for therapeutic drug level monitoring: Secondary | ICD-10-CM | POA: Diagnosis not present

## 2018-11-05 DIAGNOSIS — M542 Cervicalgia: Secondary | ICD-10-CM | POA: Diagnosis not present

## 2018-11-05 DIAGNOSIS — M5136 Other intervertebral disc degeneration, lumbar region: Secondary | ICD-10-CM | POA: Diagnosis not present

## 2018-11-05 DIAGNOSIS — M546 Pain in thoracic spine: Secondary | ICD-10-CM | POA: Diagnosis not present

## 2018-11-05 DIAGNOSIS — M545 Low back pain: Secondary | ICD-10-CM | POA: Diagnosis not present

## 2018-11-05 DIAGNOSIS — Z79899 Other long term (current) drug therapy: Secondary | ICD-10-CM | POA: Diagnosis not present

## 2018-12-04 DIAGNOSIS — Z20828 Contact with and (suspected) exposure to other viral communicable diseases: Secondary | ICD-10-CM | POA: Diagnosis not present

## 2018-12-28 DIAGNOSIS — L4050 Arthropathic psoriasis, unspecified: Secondary | ICD-10-CM | POA: Diagnosis not present

## 2018-12-28 DIAGNOSIS — Z79899 Other long term (current) drug therapy: Secondary | ICD-10-CM | POA: Diagnosis not present

## 2018-12-28 DIAGNOSIS — M159 Polyosteoarthritis, unspecified: Secondary | ICD-10-CM | POA: Diagnosis not present

## 2019-01-13 ENCOUNTER — Ambulatory Visit
Admission: RE | Admit: 2019-01-13 | Discharge: 2019-01-13 | Disposition: A | Discharge status: Home / Self Care | Payer: BC Managed Care – PPO | Source: Ambulatory Visit | Attending: Family Medicine | Admitting: Family Medicine

## 2019-01-13 DIAGNOSIS — Z1231 Encounter for screening mammogram for malignant neoplasm of breast: Secondary | ICD-10-CM

## 2019-01-18 ENCOUNTER — Telehealth: Payer: Self-pay

## 2019-01-18 NOTE — Telephone Encounter (Signed)
-----   Message from Margo Common, [place] sent at 01/14/2019  6:09 PM EDT ----- Normal mammograms. Repeat annually.

## 2019-02-04 DIAGNOSIS — M546 Pain in thoracic spine: Secondary | ICD-10-CM | POA: Diagnosis not present

## 2019-02-04 DIAGNOSIS — M5136 Other intervertebral disc degeneration, lumbar region: Secondary | ICD-10-CM | POA: Diagnosis not present

## 2019-02-04 DIAGNOSIS — M542 Cervicalgia: Secondary | ICD-10-CM | POA: Diagnosis not present

## 2019-02-04 DIAGNOSIS — M545 Low back pain: Secondary | ICD-10-CM | POA: Diagnosis not present

## 2019-03-26 DIAGNOSIS — M5136 Other intervertebral disc degeneration, lumbar region: Secondary | ICD-10-CM | POA: Diagnosis not present

## 2019-03-26 DIAGNOSIS — M546 Pain in thoracic spine: Secondary | ICD-10-CM | POA: Diagnosis not present

## 2019-03-26 DIAGNOSIS — M5030 Other cervical disc degeneration, unspecified cervical region: Secondary | ICD-10-CM | POA: Diagnosis not present

## 2019-03-26 DIAGNOSIS — M5134 Other intervertebral disc degeneration, thoracic region: Secondary | ICD-10-CM | POA: Diagnosis not present

## 2019-04-06 DIAGNOSIS — L4050 Arthropathic psoriasis, unspecified: Secondary | ICD-10-CM | POA: Diagnosis not present

## 2019-04-06 DIAGNOSIS — Z79899 Other long term (current) drug therapy: Secondary | ICD-10-CM | POA: Diagnosis not present

## 2019-04-06 DIAGNOSIS — M159 Polyosteoarthritis, unspecified: Secondary | ICD-10-CM | POA: Diagnosis not present

## 2019-04-27 DIAGNOSIS — Z20828 Contact with and (suspected) exposure to other viral communicable diseases: Secondary | ICD-10-CM | POA: Diagnosis not present

## 2019-04-30 DIAGNOSIS — G894 Chronic pain syndrome: Secondary | ICD-10-CM | POA: Diagnosis not present

## 2019-05-11 DIAGNOSIS — Z79899 Other long term (current) drug therapy: Secondary | ICD-10-CM | POA: Diagnosis not present

## 2019-05-11 DIAGNOSIS — R252 Cramp and spasm: Secondary | ICD-10-CM | POA: Diagnosis not present

## 2019-05-11 DIAGNOSIS — M064 Inflammatory polyarthropathy: Secondary | ICD-10-CM | POA: Diagnosis not present

## 2019-05-17 DIAGNOSIS — R945 Abnormal results of liver function studies: Secondary | ICD-10-CM | POA: Diagnosis not present

## 2019-06-10 DIAGNOSIS — Z79899 Other long term (current) drug therapy: Secondary | ICD-10-CM | POA: Diagnosis not present

## 2019-06-10 DIAGNOSIS — M064 Inflammatory polyarthropathy: Secondary | ICD-10-CM | POA: Diagnosis not present

## 2019-06-24 ENCOUNTER — Other Ambulatory Visit: Payer: Self-pay | Admitting: Family Medicine

## 2019-06-24 DIAGNOSIS — I10 Essential (primary) hypertension: Secondary | ICD-10-CM

## 2019-06-24 NOTE — Telephone Encounter (Signed)
Requested medication (s) are due for refill today: yes  Requested medication (s) are on the active medication list: yes  Last refill:  03/26/2019  Future visit scheduled: no  Notes to clinic:  no valid encounter within last 6 months    Requested Prescriptions  Pending Prescriptions Disp Refills   hydrochlorothiazide (HYDRODIURIL) 12.5 MG tablet [Pharmacy Med Name: HYDROCHLOROTHIAZIDE TABS 12.5MG ] 90 tablet 3    Sig: TAKE 1 TABLET DAILY      Cardiovascular: Diuretics - Thiazide Failed - 06/24/2019  9:20 AM      Failed - Ca in normal range and within 360 days    Calcium  Date Value Ref Range Status  04/01/2018 9.8  Final          Failed - Cr in normal range and within 360 days    Creatinine  Date Value Ref Range Status  04/01/2018 0.7 0.5 - 1.1 Final          Failed - K in normal range and within 360 days    Potassium  Date Value Ref Range Status  04/01/2018 4.1 3.4 - 5.3 Final          Failed - Na in normal range and within 360 days    Sodium  Date Value Ref Range Status  04/01/2018 140 137 - 147 Final          Failed - Valid encounter within last 6 months    Recent Outpatient Visits           11 months ago Encounter for annual physical examination excluding gynecological examination in a patient older than 17 years   [facility], [place], [place]   1 year ago Hypothyroidism, unspecified type   [facility], Vickki Muff, [place]   3 years ago Need for influenza vaccination   [facility], [place], [place]   4 years ago Essential hypertension   [place], [place]              Passed - Last BP in normal range    BP Readings from Last 1 Encounters:  07/02/18 120/78

## 2019-06-28 ENCOUNTER — Telehealth: Payer: Self-pay | Admitting: Family Medicine

## 2019-06-28 NOTE — Telephone Encounter (Signed)
[facility], waited 20 minutes and had to hang up.

## 2019-06-28 NOTE — Telephone Encounter (Signed)
Copied from [place] [id number]. Topic: General - Other >> Jun 28, 2019 11:55 AM Keene Breath wrote: Reason for CRM: Called to request an allergy list for patient before the script for hydrochlorothiazide (HYDRODIURIL) 12.5 MG tablet can be approved.  Please call to discuss if needed at [phone], Ref. # [id number]

## 2019-06-30 DIAGNOSIS — L4050 Arthropathic psoriasis, unspecified: Secondary | ICD-10-CM | POA: Diagnosis not present

## 2019-06-30 DIAGNOSIS — Z79899 Other long term (current) drug therapy: Secondary | ICD-10-CM | POA: Diagnosis not present

## 2019-06-30 DIAGNOSIS — M159 Polyosteoarthritis, unspecified: Secondary | ICD-10-CM | POA: Diagnosis not present

## 2019-06-30 DIAGNOSIS — R5383 Other fatigue: Secondary | ICD-10-CM | POA: Diagnosis not present

## 2019-07-02 ENCOUNTER — Other Ambulatory Visit: Payer: Self-pay

## 2019-07-02 ENCOUNTER — Ambulatory Visit: Payer: BC Managed Care – PPO | Admitting: Family Medicine

## 2019-07-02 ENCOUNTER — Encounter: Payer: Self-pay | Admitting: Family Medicine

## 2019-07-02 VITALS — BP 134/83 | HR 76 | Temp 97.6°F | Resp 16 | Ht 65.0 in | Wt 165.0 lb

## 2019-07-02 DIAGNOSIS — K2100 Gastro-esophageal reflux disease with esophagitis, without bleeding: Secondary | ICD-10-CM

## 2019-07-02 DIAGNOSIS — M5136 Other intervertebral disc degeneration, lumbar region: Secondary | ICD-10-CM | POA: Diagnosis not present

## 2019-07-02 DIAGNOSIS — G4731 Primary central sleep apnea: Secondary | ICD-10-CM | POA: Diagnosis not present

## 2019-07-02 DIAGNOSIS — M5030 Other cervical disc degeneration, unspecified cervical region: Secondary | ICD-10-CM | POA: Diagnosis not present

## 2019-07-02 DIAGNOSIS — I10 Essential (primary) hypertension: Secondary | ICD-10-CM | POA: Diagnosis not present

## 2019-07-02 DIAGNOSIS — M5134 Other intervertebral disc degeneration, thoracic region: Secondary | ICD-10-CM | POA: Diagnosis not present

## 2019-07-02 DIAGNOSIS — E039 Hypothyroidism, unspecified: Secondary | ICD-10-CM

## 2019-07-02 DIAGNOSIS — M546 Pain in thoracic spine: Secondary | ICD-10-CM | POA: Diagnosis not present

## 2019-07-02 DIAGNOSIS — M1990 Unspecified osteoarthritis, unspecified site: Secondary | ICD-10-CM

## 2019-07-02 MED ORDER — METOPROLOL SUCCINATE ER 25 MG PO TB24: 25.0000 mg | ORAL_TABLET | Freq: Every day | ORAL | 3 refills | Status: DC

## 2019-07-02 MED ORDER — HYDROCHLOROTHIAZIDE 12.5 MG PO TABS: 12.5000 mg | ORAL_TABLET | Freq: Every day | ORAL | 3 refills | Status: DC

## 2019-07-02 MED ORDER — THYROID 30 MG PO TABS: ORAL_TABLET | ORAL | 3 refills | Status: DC

## 2019-07-02 NOTE — Progress Notes (Signed)
Patient: Tami Finley Female    DOB: Oct 25, 1965   54 y.o.   MRN: [id number] Visit Date: 07/02/2019  Today's Provider: Vernie Murders, PA   Chief Complaint  Patient presents with  . Hypertension  . Hypothyroidism  . Fatigue   Subjective:     HPI  Hypertension, follow-up:  BP Readings from Last 3 Encounters:  07/02/19 134/83  07/02/18 120/78  07/08/17 114/70    She was last seen for hypertension 1 years ago.  BP at that visit was 120/78. Management changes since that visit include no changes. She reports excellent compliance with treatment. She is not having side effects.  She is not exercising. She is not adherent to low salt diet.   Outside blood pressures are stable. She is experiencing none.  Patient denies chest pain, lower extremity edema and palpitations.   Cardiovascular risk factors include hypertension.  Use of agents associated with hypertension: none.     Weight trend: stable Wt Readings from Last 3 Encounters:  07/02/19 165 lb (74.8 kg)  07/02/18 163 lb (73.9 kg)  07/08/17 166 lb (75.3 kg)    Current diet: in general, a "healthy" diet    ------------------------------------------------------------------------  Hypothyroid, follow-up:  TSH  Date Value Ref Range Status  04/01/2018 1.16 0.41 - 5.90 Final  04/10/2017 0.82 0.41 - 5.90 Final  12/09/2013 0.60 0.41 - 5.90 uIU/mL Final   Wt Readings from Last 3 Encounters:  07/02/19 165 lb (74.8 kg)  07/02/18 163 lb (73.9 kg)  07/08/17 166 lb (75.3 kg)    She was last seen for hypothyroid 1 years ago.  Management since that visit includes no changes. She reports excellent compliance with treatment. She is not having side effects.  She is not exercising. She is experiencing weight changes She denies change in energy level, diarrhea, heat / cold intolerance, nervousness and palpitations Weight trend:  stable  ------------------------------------------------------------------------   Insomnia  She presents today for evaluation of insomnia. Onset was few months ago. Insomnia is getting unchanged. She has trouble sleeping several times a week.   She does not have difficulty FALLING asleep. She has difficulty STAYING asleep. She does wake frequently to urinate. She does have urge to move legs when resting. She is having pain when trying to sleep  She is not having anxiety. She is not having a lot of stress in her life. . She is not having depression.  She is not taking stimulant medications. Concerned about daytime sleepiness - especially while driving. She is taking new medications:methotrexate  She is taking OTC sleeping aid. melatonin. She is taking medications to help sleep. She is not drinking alcohol to help sleep. She is not using illicit drugs.  -------------------------------------------------------------------- Past Medical History:  Diagnosis Date  . Allergy   . Anxiety   . Hyperlipidemia   . Hypertension   . Thyroid disease    Past Surgical History:  Procedure Laterality Date  . CHOLECYSTECTOMY  06/04/2007   Family History  Problem Relation Age of Onset  . Hypertension Mother   . Hyperlipidemia Mother   . Thyroid disease Mother   . Hypertension Father   . Hyperlipidemia Father   . Diabetes Sister   . Hypertension Sister   . Thyroid disease Sister   . Thyroid disease Maternal Grandmother   . Breast cancer Other    Allergies  Allergen Reactions  . Other     Other reaction(s): Headache, Other Other reaction(s): Headache, Other  . Penicillins Rash  Current Outpatient Medications:  .  doxepin (SINEQUAN) 10 MG capsule, doxepin 10 mg capsule  1 to 2 capsules in pm as needed for sleep, Disp: , Rfl:  .  fluticasone (FLONASE) 50 MCG/ACT nasal spray, , Disp: , Rfl:  .  folic acid (FOLVITE) 1 MG tablet, folic acid 1 mg tablet  Take 1 tablet every day  by oral route., Disp: , Rfl:  .  hydrochlorothiazide (HYDRODIURIL) 12.5 MG tablet, TAKE 1 TABLET DAILY, Disp: 90 tablet, Rfl: 0 .  methotrexate 250 MG/10ML injection, methotrexate sodium 25 mg/mL injection solution  Inject 0.5ML subcu ONCE A WEEK, Disp: , Rfl:  .  metoprolol succinate (TOPROL-XL) 25 MG 24 hr tablet, Take 1 tablet (25 mg total) by mouth daily., Disp: 90 tablet, Rfl: 3 .  NARCAN 4 MG/0.1ML LIQD nasal spray kit, USE 1 SPRAY IN 1 NOSTRIL EVERY 2 MINUTES UNTIL RESPONSIVE/EMS ARRIVES, Disp: , Rfl:  .  oxyCODONE-acetaminophen (PERCOCET) 7.5-325 MG tablet, PLEASE SEE ATTACHED FOR DETAILED DIRECTIONS, Disp: , Rfl:  .  Probiotic CAPS, Take 1 capsule by mouth daily., Disp: , Rfl:  .  thyroid (ARMOUR THYROID) 30 MG tablet, TAKE 1 TABLET DAILY, Disp: 30 tablet, Rfl: 11  Review of Systems  Constitutional: Positive for activity change, fatigue and unexpected weight change.  Respiratory: Negative for cough, shortness of breath and wheezing.   Cardiovascular: Negative for chest pain, palpitations and leg swelling.  Neurological: Negative for headaches.   Social History   Tobacco Use  . Smoking status: Never Smoker  . Smokeless tobacco: Never Used  Substance Use Topics  . Alcohol use: Yes     Objective:   BP 134/83 (BP Location: Right Arm, Patient Position: Sitting, Cuff Size: Large)   Pulse 76   Temp 97.6 F (36.4 C) (Temporal)   Resp 16   Ht '5\' 5"'$  (1.651 m)   Wt 165 lb (74.8 kg)   LMP 05/21/2003 Comment: IUD.  LMP 12 yrs ago.  BMI 27.46 kg/m  Vitals:   07/02/19 1556  BP: 134/83  Pulse: 76  Resp: 16  Temp: 97.6 F (36.4 C)  TempSrc: Temporal  Weight: 165 lb (74.8 kg)  Height: '5\' 5"'$  (1.651 m)  Body mass index is 27.46 kg/m.  Physical Exam Constitutional:      Appearance: She is well-developed.  HENT:     Head: Normocephalic and atraumatic.     Right Ear: External ear normal.     Left Ear: External ear normal.     Nose: Nose normal.  Eyes:     General:         Right eye: No discharge.     Conjunctiva/sclera: Conjunctivae normal.     Pupils: Pupils are equal, round, and reactive to light.  Neck:     Thyroid: No thyromegaly.     Trachea: No tracheal deviation.  Cardiovascular:     Rate and Rhythm: Normal rate and regular rhythm.     Heart sounds: Normal heart sounds. No murmur.  Pulmonary:     Effort: Pulmonary effort is normal. No respiratory distress.     Breath sounds: Normal breath sounds. No wheezing or rales.  Chest:     Chest wall: No tenderness.  Abdominal:     General: There is no distension.     Palpations: Abdomen is soft. There is no mass.     Tenderness: There is no abdominal tenderness. There is no guarding or rebound.  Musculoskeletal:        General: No tenderness.  Normal range of motion.     Cervical back: Normal range of motion and neck supple.  Lymphadenopathy:     Cervical: No cervical adenopathy.  Skin:    General: Skin is warm and dry.     Findings: No erythema or rash.  Neurological:     Mental Status: She is alert and oriented to person, place, and time.     Cranial Nerves: No cranial nerve deficit.     Motor: No abnormal muscle tone.     Coordination: Coordination normal.     Deep Tendon Reflexes: Reflexes are normal and symmetric. Reflexes normal.  Psychiatric:        Behavior: Behavior normal.        Thought Content: Thought content normal.        Judgment: Judgment normal.       Assessment & Plan    1. Essential hypertension Stable BP today. Tolerating HCTZ 12.5 mg qd with Metoprolol Succinate 25 mg qd. Restrict sodium intake and caffeine use. Refill these prescriptions and check routine labs. Follow up pending reports. - CBC with Differential/Platelet - Comprehensive metabolic panel - TSH - Lipid panel - hydrochlorothiazide (HYDRODIURIL) 12.5 MG tablet; Take 1 tablet (12.5 mg total) by mouth daily.  Dispense: 90 tablet; Refill: 3 - metoprolol succinate (TOPROL-XL) 25 MG 24 hr tablet; Take 1 tablet  (25 mg total) by mouth daily.  Dispense: 90 tablet; Refill: 3  2. Hypothyroidism, unspecified type Still on Armour Thyroid 30 mg qd. May be having fatigue due to low thyroid function. No palpitations, tremor, diarrhea or hair loss. Recheck labs and refill prescription. - CBC with Differential/Platelet - TSH - T4 - thyroid (ARMOUR THYROID) 30 MG tablet; TAKE 1 TABLET DAILY  Dispense: 90 tablet; Refill: 3  3. Primary central sleep apnea Very sleepy when driving alone. Given Epworth Sleepiness Questionnaire to fill out with spouse. May need sleep study.  4. Arthritis Has had long term degenerative and psoriatic arthritis. Presently on Methotrexate injection once a week and followed by [facility]. Recheck labs and follow up prn. - CBC with Differential/Platelet  5. Gastroesophageal reflux disease with esophagitis without hemorrhage Having reflux frequently at night. Recommend she continue Famotidine at bedtime, restrict caffeine, eating late, restrict fats in diet and may need an evaluation by a gastroenterologist. No hematemesis or melena. Elevate the head of her bed to reduce reflux.     Vernie Murders, PA  [facility]

## 2019-07-05 ENCOUNTER — Encounter: Payer: Self-pay | Admitting: Family Medicine

## 2019-07-12 DIAGNOSIS — M1990 Unspecified osteoarthritis, unspecified site: Secondary | ICD-10-CM | POA: Diagnosis not present

## 2019-07-12 DIAGNOSIS — E039 Hypothyroidism, unspecified: Secondary | ICD-10-CM | POA: Diagnosis not present

## 2019-07-12 DIAGNOSIS — Z79899 Other long term (current) drug therapy: Secondary | ICD-10-CM | POA: Diagnosis not present

## 2019-07-12 DIAGNOSIS — I10 Essential (primary) hypertension: Secondary | ICD-10-CM | POA: Diagnosis not present

## 2019-07-12 DIAGNOSIS — L4050 Arthropathic psoriasis, unspecified: Secondary | ICD-10-CM | POA: Diagnosis not present

## 2019-07-13 ENCOUNTER — Other Ambulatory Visit: Payer: Self-pay

## 2019-07-13 ENCOUNTER — Telehealth: Payer: Self-pay

## 2019-07-13 DIAGNOSIS — D2272 Melanocytic nevi of left lower limb, including hip: Secondary | ICD-10-CM | POA: Diagnosis not present

## 2019-07-13 DIAGNOSIS — Z1283 Encounter for screening for malignant neoplasm of skin: Secondary | ICD-10-CM | POA: Diagnosis not present

## 2019-07-13 DIAGNOSIS — R748 Abnormal levels of other serum enzymes: Secondary | ICD-10-CM

## 2019-07-13 DIAGNOSIS — D2239 Melanocytic nevi of other parts of face: Secondary | ICD-10-CM | POA: Diagnosis not present

## 2019-07-13 DIAGNOSIS — D2261 Melanocytic nevi of right upper limb, including shoulder: Secondary | ICD-10-CM | POA: Diagnosis not present

## 2019-07-13 DIAGNOSIS — E785 Hyperlipidemia, unspecified: Secondary | ICD-10-CM

## 2019-07-13 LAB — CBC WITH DIFFERENTIAL/PLATELET
Basophils Absolute: 0 10*3/uL (ref 0.0–0.2)
Basos: 0 %
EOS (ABSOLUTE): 0.1 10*3/uL (ref 0.0–0.4)
Eos: 1 %
Hematocrit: 36.7 % (ref 34.0–46.6)
Hemoglobin: 12.7 g/dL (ref 11.1–15.9)
Immature Grans (Abs): 0 10*3/uL (ref 0.0–0.1)
Immature Granulocytes: 0 %
Lymphocytes Absolute: 2.5 10*3/uL (ref 0.7–3.1)
Lymphs: 48 %
MCH: 31.1 pg (ref 26.6–33.0)
MCHC: 34.6 g/dL (ref 31.5–35.7)
MCV: 90 fL (ref 79–97)
Monocytes Absolute: 0.4 10*3/uL (ref 0.1–0.9)
Monocytes: 8 %
Neutrophils Absolute: 2.3 10*3/uL (ref 1.4–7.0)
Neutrophils: 43 %
Platelets: 344 10*3/uL (ref 150–450)
RBC: 4.09 x10E6/uL (ref 3.77–5.28)
RDW: 13.1 % (ref 11.7–15.4)
WBC: 5.3 10*3/uL (ref 3.4–10.8)

## 2019-07-13 LAB — TSH: TSH: 1.05 u[IU]/mL (ref 0.450–4.500)

## 2019-07-13 LAB — LIPID PANEL
Chol/HDL Ratio: 4.3 ratio (ref 0.0–4.4)
Cholesterol, Total: 241 mg/dL — ABNORMAL HIGH (ref 100–199)
HDL: 56 mg/dL (ref >39)
LDL Chol Calc (NIH): 142 mg/dL — ABNORMAL HIGH (ref 0–99)
Triglycerides: 241 mg/dL — ABNORMAL HIGH (ref 0–149)
VLDL Cholesterol Cal: 43 mg/dL — ABNORMAL HIGH (ref 5–40)

## 2019-07-13 LAB — COMPREHENSIVE METABOLIC PANEL
ALT: 29 IU/L (ref 0–32)
AST: 27 IU/L (ref 0–40)
Albumin/Globulin Ratio: 1.8 (ref 1.2–2.2)
Albumin: 4.5 g/dL (ref 3.8–4.9)
Alkaline Phosphatase: 140 IU/L — ABNORMAL HIGH (ref 39–117)
BUN/Creatinine Ratio: 20 (ref 9–23)
BUN: 15 mg/dL (ref 6–24)
Bilirubin Total: 0.2 mg/dL (ref 0.0–1.2)
CO2: 23 mmol/L (ref 20–29)
Calcium: 10.2 mg/dL (ref 8.7–10.2)
Chloride: 99 mmol/L (ref 96–106)
Creatinine, Ser: 0.76 mg/dL (ref 0.57–1.00)
GFR calc Af Amer: 104 mL/min/{1.73_m2} (ref >59)
GFR calc non Af Amer: 90 mL/min/{1.73_m2} (ref >59)
Globulin, Total: 2.5 g/dL (ref 1.5–4.5)
Glucose: 93 mg/dL (ref 65–99)
Potassium: 4.2 mmol/L (ref 3.5–5.2)
Sodium: 139 mmol/L (ref 134–144)
Total Protein: 7 g/dL (ref 6.0–8.5)

## 2019-07-13 LAB — T4: T4, Total: 7 ug/dL (ref 4.5–12.0)

## 2019-07-13 NOTE — Telephone Encounter (Signed)
Rheumatologist would probably like [place] to follow liver tests (including alkaline phosphatase) when rechecking lipids.

## 2019-07-13 NOTE — Telephone Encounter (Signed)
While ordering this I thought about the Alk Phos, do we need to recheck it as well?

## 2019-07-14 ENCOUNTER — Encounter: Payer: Self-pay | Admitting: Family Medicine

## 2019-07-19 ENCOUNTER — Other Ambulatory Visit: Payer: Self-pay | Admitting: Family Medicine

## 2019-07-19 DIAGNOSIS — G4731 Primary central sleep apnea: Secondary | ICD-10-CM

## 2019-07-27 DIAGNOSIS — R0602 Shortness of breath: Secondary | ICD-10-CM | POA: Diagnosis not present

## 2019-07-27 DIAGNOSIS — G4733 Obstructive sleep apnea (adult) (pediatric): Secondary | ICD-10-CM | POA: Diagnosis not present

## 2019-07-27 LAB — PULMONARY FUNCTION TEST

## 2019-07-28 DIAGNOSIS — R0602 Shortness of breath: Secondary | ICD-10-CM | POA: Diagnosis not present

## 2019-07-28 DIAGNOSIS — G4733 Obstructive sleep apnea (adult) (pediatric): Secondary | ICD-10-CM | POA: Diagnosis not present

## 2019-08-04 ENCOUNTER — Other Ambulatory Visit: Payer: Self-pay

## 2019-08-04 DIAGNOSIS — G4710 Hypersomnia, unspecified: Secondary | ICD-10-CM

## 2019-08-09 ENCOUNTER — Telehealth: Payer: Self-pay

## 2019-08-09 NOTE — Telephone Encounter (Signed)
Tami Finley, Can you get this information sent to her neurologist.  [name] did refer her to the neurologist because of neormal sleep study so it should have gone with her referral but I guess they didn't do that.  Thanks

## 2019-08-09 NOTE — Telephone Encounter (Signed)
Copied from Person [id number]. Topic: General - Other >> Aug 09, 2019 11:19 AM Celene Kras wrote: Reason for CRM: [facility] called and is requesting to know if pt had a sleep study. They are requesting access to those records. Please advise.

## 2019-08-10 ENCOUNTER — Encounter: Payer: Self-pay | Admitting: Family Medicine

## 2019-08-10 NOTE — Telephone Encounter (Signed)
Sleep study has been faxed to [facility]. Thanks [name]

## 2019-08-17 ENCOUNTER — Other Ambulatory Visit: Payer: Self-pay

## 2019-08-17 ENCOUNTER — Ambulatory Visit (INDEPENDENT_AMBULATORY_CARE_PROVIDER_SITE_OTHER): Payer: BC Managed Care – PPO | Admitting: Neurology

## 2019-08-17 ENCOUNTER — Encounter: Payer: Self-pay | Admitting: Neurology

## 2019-08-17 ENCOUNTER — Encounter: Payer: Self-pay | Admitting: Family Medicine

## 2019-08-17 VITALS — BP 173/84 | HR 90 | Ht 65.0 in | Wt 163.0 lb

## 2019-08-17 DIAGNOSIS — R0683 Snoring: Secondary | ICD-10-CM | POA: Diagnosis not present

## 2019-08-17 DIAGNOSIS — F1190 Opioid use, unspecified, uncomplicated: Secondary | ICD-10-CM

## 2019-08-17 DIAGNOSIS — E663 Overweight: Secondary | ICD-10-CM

## 2019-08-17 DIAGNOSIS — R6889 Other general symptoms and signs: Secondary | ICD-10-CM

## 2019-08-17 DIAGNOSIS — G4700 Insomnia, unspecified: Secondary | ICD-10-CM

## 2019-08-17 DIAGNOSIS — R400 Somnolence: Secondary | ICD-10-CM | POA: Diagnosis not present

## 2019-08-17 DIAGNOSIS — G479 Sleep disorder, unspecified: Secondary | ICD-10-CM

## 2019-08-17 NOTE — Patient Instructions (Addendum)
  Thank you for choosing [facility] for your sleep related care! It was nice to meet you today! I appreciate that you entrust me with your sleep related healthcare concerns. I hope, I was able to address at least some of your concerns today, and that I can help you get to the bottom of your sleep related issues.     Here is what we discussed today and what we came up with as our plan for you:  1. Your recent home sleep test was negative for sleep apnea.   2. Based on your sleep related symptoms, we should look into your severe sleepiness with a nighttime lab-attended sleep study, followed by a daytime nap study. I would like for you to come back for an overnight sleep study during which we will monitor your night time sleep and we will do nap study testing the next day: 5 scheduled 20 min nap opportunities, every 2 hours. We will remind you to stay awake in between naps.    In preparation for sleep study testing, as discussed, you would have to taper off certain meds, including oxycodone, oxycontin, and doxepin. You would have to be off these meds for 2 weeks prior to sleep study testing. Please, do no drive when sleepy! Pull over to rest if you feel sleepy while driving. Please talk to your prescribing doctor about coming off and staying off the above meds for sleep testing.  We will go ahead and seek insurance authorization for testing.  Please keep your sleep schedule stable and do not add any additional medications or caffeine in your day to day routine, in preparation of the studies.  The reasons, why you are sleepy during the day, may include:  Underlying sleep apnea which was not detected by your recent home sleep test, medication side effects causing drowsiness during the day, sleep deprivation, underlying narcolepsy or another hypersomnolence disorder, meaning a condition that makes you abnormally sleepy and is not explained by narcolepsy or another condition or by medication  side effects.  Our sleep lab administrative assistant will call you to schedule your sleep study. If you don't hear back from her by about 2 weeks from now, please feel free to call her at [phone]. You can leave a message with your phone number and concerns, if you get the voicemail box. She will call back as soon as possible.

## 2019-08-17 NOTE — Progress Notes (Signed)
Subjective:    Patient ID: Tami Finley is a 54 y.o. female.  HPI     Star Age, MD, PhD  [facility] [address] [place], [place] [address]  Dear [name],   I saw your patient, Tami Finley, upon your kind request in my sleep clinic today for initial consultation of her sleep disorder, in particular, her excessive daytime somnolence.  The patient is unaccompanied today.  As you know, [name] is a 54 year old right-handed woman with an underlying medical history of allergies, anxiety, hypertension, hyperlipidemia, thyroid disease, arthritis and chronic pain, on chronic narcotic pain medication, who reports a longstanding history of tiredness during the day for years but more severe sleepiness in the past few months, maybe up to 6 months.  She denies a history of severe sleepiness as a child or teenager or young adults.  She does report that she used to need more sleep as a child.  She denies any telltale symptoms of cataplexy or sleep paralysis or hypnagogic or hypnopompic hallucinations but reports that when she is asleep for short periods of time she can have vivid dreams.  Of note, she has been on narcotic pain medication for years, has been on oxycodone for over 5 years.  She used to be on morphine long-acting which was switched to OxyContin in November 2020.  She is sleepy at the wheel, she has a difficult time staying awake during her work related commute.  She works as a Marketing executive.  She has no family history of narcolepsy.  She has no family history of OSA.  She does snore.  She had a home sleep test recently on 07/27/2019 which showed a AHI of 3/h, minimum oxygen saturation was 90%. About a month ago she was started on doxepin at night, 10 mg strength, she takes 2 pills at bedtime.  She currently also takes oxycodone 7.5 mg strength twice daily and OxyContin 15 mg strength twice daily.  Her Epworth sleepiness score is 21 out of 24 today,  fatigue severity score is 54 out of 63.  She is a non-smoker and drinks alcohol very occasionally, no daily caffeine.  She tries to keep a set sleep schedule. She has a longer standing history of difficulty maintaining sleep.  She sees her pain management doctor on a 6 monthly basis and she also sees rheumatology on a 6 monthly basis typically. Her bedtime is typically between 930 and 10 and rise time around 6.  She does not have night to night nocturia.  She has had occasional restless legs type symptoms.  Her Past Medical History Is Significant For: Past Medical History:  Diagnosis Date  . Allergy   . Anxiety   . Hyperlipidemia   . Hypertension   . Thyroid disease     Her Past Surgical History Is Significant For: Past Surgical History:  Procedure Laterality Date  . CHOLECYSTECTOMY  06/04/2007    Her Family History Is Significant For: Family History  Problem Relation Age of Onset  . Hypertension Mother   . Hyperlipidemia Mother   . Thyroid disease Mother   . Hypertension Father   . Hyperlipidemia Father   . Diabetes Sister   . Hypertension Sister   . Thyroid disease Sister   . Thyroid disease Maternal Grandmother   . Breast cancer Other     Her Social History Is Significant For: Social History   Socioeconomic History  . Marital status: Married    Spouse name: Not  on file  . Number of children: 2  . Years of education: H/S  . Highest education level: Not on file  Occupational History  . Occupation: Full-Time  Tobacco Use  . Smoking status: Never Smoker  . Smokeless tobacco: Never Used  Substance and Sexual Activity  . Alcohol use: Yes  . Drug use: No  . Sexual activity: Not on file  Other Topics Concern  . Not on file  Social History Narrative  . Not on file   Social Determinants of Health   Financial Resource Strain:   . Difficulty of Paying Living Expenses:   Food Insecurity:   . Worried About Charity fundraiser in the Last Year:   . Arboriculturist in  the Last Year:   Transportation Needs:   . Film/video editor (Medical):   Marland Kitchen Lack of Transportation (Non-Medical):   Physical Activity:   . Days of Exercise per Week:   . Minutes of Exercise per Session:   Stress:   . Feeling of Stress :   Social Connections:   . Frequency of Communication with Friends and Family:   . Frequency of Social Gatherings with Friends and Family:   . Attends Religious Services:   . Active Member of Clubs or Organizations:   . Attends Archivist Meetings:   Marland Kitchen Marital Status:     Her Allergies Are:  Allergies  Allergen Reactions  . Other     Other reaction(s): Headache, Other Other reaction(s): Headache, Other  . Penicillins Rash  :   Her Current Medications Are:  Outpatient Encounter Medications as of 08/17/2019  Medication Sig  . doxepin (SINEQUAN) 10 MG capsule doxepin 10 mg capsule  1 to 2 capsules in pm as needed for sleep  . fluticasone (FLONASE) 50 MCG/ACT nasal spray   . hydrochlorothiazide (HYDRODIURIL) 12.5 MG tablet Take 1 tablet (12.5 mg total) by mouth daily.  . metoprolol succinate (TOPROL-XL) 25 MG 24 hr tablet Take 1 tablet (25 mg total) by mouth daily.  Marland Kitchen NARCAN 4 MG/0.1ML LIQD nasal spray kit USE 1 SPRAY IN 1 NOSTRIL EVERY 2 MINUTES UNTIL RESPONSIVE/EMS ARRIVES  . oxyCODONE (OXYCONTIN) 15 mg 12 hr tablet Take 15 mg by mouth every 12 (twelve) hours.  Marland Kitchen oxyCODONE-acetaminophen (PERCOCET) 7.5-325 MG tablet PLEASE SEE ATTACHED FOR DETAILED DIRECTIONS  . Probiotic CAPS Take 1 capsule by mouth daily.  Marland Kitchen thyroid (ARMOUR THYROID) 30 MG tablet TAKE 1 TABLET DAILY  . [DISCONTINUED] folic acid (FOLVITE) 1 MG tablet folic acid 1 mg tablet  Take 1 tablet every day by oral route.  . [DISCONTINUED] methotrexate 250 MG/10ML injection methotrexate sodium 25 mg/mL injection solution  Inject 0.5ML subcu ONCE A WEEK   No facility-administered encounter medications on file as of 08/17/2019.  :  Review of Systems:  Out of a complete 14  point review of systems, all are reviewed and negative with the exception of these symptoms as listed below: Review of Systems  Neurological:       Pt presents today to discuss her sleep. Pt had a recent HST that she reports was negative for osa. Pt does endorse snoring.   Epworth Sleepiness Scale 0= would never doze 1= slight chance of dozing 2= moderate chance of dozing 3= high chance of dozing  Sitting and reading: 3  Watching TV: 2 Sitting inactive in a public place (ex. Theater or meeting): 3 As a passenger in a car for an hour without a break: 3 Lying down  to rest in the afternoon: 3 Sitting and talking to someone: 2 Sitting quietly after lunch (no alcohol): 3 In a car, while stopped in traffic: 2 Total: 21     Objective:  Neurological Exam  Physical Exam Physical Examination:   Vitals:   08/17/19 0750  BP: (!) 173/84  Pulse: 90    General Examination: The patient is a very pleasant 54 y.o. female in no acute distress. She appears well-developed and well-nourished and well groomed.   HEENT: Normocephalic, atraumatic, pupils are equal, round and reactive to light, extraocular tracking is good without limitation to gaze excursion or nystagmus noted. Hearing is grossly intact. Face is symmetric with normal facial animation. Speech is clear with no dysarthria noted. There is no hypophonia. There is no lip, neck/head, jaw or voice tremor. Neck is supple with full range of passive and active motion. There are no carotid bruits on auscultation. Oropharynx exam reveals: moderate mouth dryness, adequate dental hygiene and no significant airway crowding, she does have a small airway opening, Mallampati is class I, tonsils are small.  Tongue protrudes centrally in palate elevates symmetrically.   Chest: Clear to auscultation without wheezing, rhonchi or crackles noted.  Heart: S1+S2+0, regular and normal without murmurs, rubs or gallops noted.   Abdomen: Soft, non-tender and  non-distended with normal bowel sounds appreciated on auscultation.  Extremities: There is no pitting edema in the distal lower extremities bilaterally.   Skin: Warm and dry without trophic changes noted.   Musculoskeletal: exam reveals no obvious joint deformities, tenderness or joint swelling or erythema.   Neurologically:  Mental status: The patient is awake, alert and oriented in all 4 spheres. Her immediate and remote memory, attention, language skills and fund of knowledge are appropriate. There is no evidence of aphasia, agnosia, apraxia or anomia. Speech is clear with normal prosody and enunciation. Thought process is linear. Mood is normal and affect is normal.  Cranial nerves II - XII are as described above under HEENT exam.  Motor exam: Normal bulk, strength and tone is noted. There is no tremor, Romberg is negative. Fine motor skills and coordination: grossly intact.  Cerebellar testing: No dysmetria or intention tremor. There is no truncal or gait ataxia.  Sensory exam: intact to light touch in the upper and lower extremities.  Gait, station and balance: She stands easily. No veering to one side is noted. No leaning to one side is noted. Posture is age-appropriate and stance is narrow based. Gait shows normal stride length and normal pace. No problems turning are noted. Tandem walk is unremarkable.                Assessment and Plan:   In summary, AMARIE VILES is a very pleasant 54 y.o.-year old female with an underlying medical history of allergies, anxiety, hypertension, hyperlipidemia, thyroid disease, arthritis and chronic pain, on chronic narcotic pain medication, who presents for evaluation of her excessive daytime somnolence of several months duration.  She had a recent home sleep test earlier this month which was negative for obstructive sleep apnea.  I have a long discussion with the patient, her symptoms and my findings.  Differential diagnosis for her hypersomnolence may  still include underlying sleep disordered breathing which was not captured on home sleep testing, medication effect from taking potentially sedating medications including OxyContin and oxycodone as well as doxepin.  While her history is not telltale for narcolepsy, an underlying intrinsic hypersomnolence disorder is not excluded, such as narcolepsy without cataplexy or  idiopathic hypersomnolence.  I would recommend that we proceed with additional sleep-related testing in the form of a nocturnal laboratory attended polysomnogram with next a nap testing to evaluate her for her daytime somnolence.  During the nighttime sleep study we will look out for an organic underlying sleep disorder at night such as sleep disordered breathing including obstructive sleep apnea or central sleep apnea or mixed sleep apnea, and periodic leg movements of sleep.  We will then do a next day nap study with 5 nap opportunities every 2 hours.  She is advised that in order to proceed with her sleep studies she would have to taper off her potentially sedating medications which would include her OxyContin, oxycodone and doxepin and her particular case.  She is reluctant to do this which is completely understandable.  She is encouraged to talk to her pain management doctor about the possibility of tapering off and staying off of these medications for 2 weeks before sleep study testing.  She is also advised that we proceed with a urine drug screen during the nap study. She is willing to consider additional testing but would like to discuss this with her pain management physician which I also encouraged.  We will seek insurance authorization for her testing.  We will call her to schedule at her convenience.  For now, she is strongly advised not to drive when feeling sleepy as she is at risk of falling asleep while driving.  If her nighttime sleep study should show evidence of obstructive sleep apnea she is encouraged to consider CPAP therapy or  AutoPap therapy. I answered all her questions today and the patient was in agreement. I plan to see her back after the sleep study is completed and encouraged her to call with any interim questions, concerns, problems or updates.   Thank you very much for allowing me to participate in the care of this nice patient. If I can be of any further assistance to you please do not hesitate to call me at [phone].  Sincerely,   Star Age, MD, PhD

## 2019-08-19 ENCOUNTER — Telehealth: Payer: Self-pay

## 2019-08-19 NOTE — Telephone Encounter (Signed)
Called patient to schedule night time sleep study and narcolepsy test. Pt hasn't received weaning schedule from PCP yet. Pt to call me back when able to start weaning meds that was discussed with her.

## 2019-09-16 ENCOUNTER — Telehealth: Payer: Self-pay

## 2019-09-16 NOTE — Telephone Encounter (Signed)
Please call patient and inquire as to what her exact question is.  If she has severe difficulty sleeping at night then she may not be able to pursue her sleep study as discussed at this point in time.  Please inquire more in detail.  As far as taking anything to help her sleep at night, she really cannot take any prescription sleep aid, but may be able to take some melatonin or Benadryl over-the-counter up to 2 weeks prior to testing for the Benadryl and up to 1 week prior to testing for melatonin.

## 2019-09-16 NOTE — Telephone Encounter (Signed)
Pt has left a voicemail stating that since she has come off all of her meds for the MSLT study, she is experiencing a difficult time getting to sleep at night. She has asked for a return call back with some suggestions, so that it also won't interfere with her MSLT study on 10/05/2019.

## 2019-09-20 NOTE — Telephone Encounter (Signed)
I have reached out to the pt so we could discuss her questions. Please see my chart message from 09/20/2019.

## 2019-09-22 ENCOUNTER — Telehealth: Payer: Self-pay

## 2019-09-22 NOTE — Telephone Encounter (Signed)
Noted thank you

## 2019-09-22 NOTE — Telephone Encounter (Signed)
Called patient and explained the bill would be for two studies. One at night and daytime study. Total charge would be $2959. She is going to check and see what her copay was and call us back.

## 2019-09-22 NOTE — Telephone Encounter (Signed)
Tami Mylar do you have any information on what the pt could expect to pay for her upcoming sleep study? Thanks!

## 2019-09-23 ENCOUNTER — Encounter: Payer: Self-pay | Admitting: Neurology

## 2019-12-24 DIAGNOSIS — M5030 Other cervical disc degeneration, unspecified cervical region: Secondary | ICD-10-CM | POA: Diagnosis not present

## 2019-12-24 DIAGNOSIS — M5136 Other intervertebral disc degeneration, lumbar region: Secondary | ICD-10-CM | POA: Diagnosis not present

## 2019-12-24 DIAGNOSIS — M546 Pain in thoracic spine: Secondary | ICD-10-CM | POA: Diagnosis not present

## 2019-12-24 DIAGNOSIS — M5134 Other intervertebral disc degeneration, thoracic region: Secondary | ICD-10-CM | POA: Diagnosis not present

## 2020-03-24 ENCOUNTER — Encounter: Payer: Self-pay | Admitting: Obstetrics and Gynecology

## 2020-03-24 ENCOUNTER — Other Ambulatory Visit (HOSPITAL_COMMUNITY)
Admission: RE | Admit: 2020-03-24 | Discharge: 2020-03-24 | Disposition: A | Discharge status: Home / Self Care | Payer: BC Managed Care – PPO | Source: Ambulatory Visit | Attending: Obstetrics and Gynecology | Admitting: Obstetrics and Gynecology

## 2020-03-24 ENCOUNTER — Other Ambulatory Visit: Payer: Self-pay

## 2020-03-24 ENCOUNTER — Ambulatory Visit (INDEPENDENT_AMBULATORY_CARE_PROVIDER_SITE_OTHER): Payer: BC Managed Care – PPO | Admitting: Obstetrics and Gynecology

## 2020-03-24 VITALS — BP 110/70 | Ht 65.0 in | Wt 165.6 lb

## 2020-03-24 DIAGNOSIS — Z124 Encounter for screening for malignant neoplasm of cervix: Secondary | ICD-10-CM

## 2020-03-24 DIAGNOSIS — Z0000 Encounter for general adult medical examination without abnormal findings: Secondary | ICD-10-CM | POA: Diagnosis not present

## 2020-03-24 DIAGNOSIS — Z1231 Encounter for screening mammogram for malignant neoplasm of breast: Secondary | ICD-10-CM

## 2020-03-24 DIAGNOSIS — Z01419 Encounter for gynecological examination (general) (routine) without abnormal findings: Secondary | ICD-10-CM | POA: Diagnosis not present

## 2020-03-24 NOTE — Progress Notes (Signed)
Pt is here for a Annual Exam.

## 2020-03-24 NOTE — Patient Instructions (Signed)
[employer] Recommended Dietary Allowances for Calcium and Vitamin D  Age (yr) Calcium Recommended Dietary Allowance (mg/day) Vitamin D Recommended Dietary Allowance (international units/day)  9-18 1,300 600  19-50 1,000 600  51-70 1,200 600  71 and older 1,200 800  Data from [employer]. Dietary reference intakes: calcium, vitamin D. [place], [place]: [employer] Academies [employer]; 2011.     Exercising to Stay Healthy To become healthy and stay healthy, it is recommended that you do moderate-intensity and vigorous-intensity exercise. You can tell that you are exercising at a moderate intensity if your heart starts beating faster and you start breathing faster but can still hold a conversation. You can tell that you are exercising at a vigorous intensity if you are breathing much harder and faster and cannot hold a conversation while exercising. Exercising regularly is important. It has many health benefits, such as:  Improving overall fitness, flexibility, and endurance.  Increasing bone density.  Helping with weight control.  Decreasing body fat.  Increasing muscle strength.  Reducing stress and tension.  Improving overall health. How often should I exercise? Choose an activity that you enjoy, and set realistic goals. Your health care provider can help you make an activity plan that works for you. Exercise regularly as told by your health care provider. This may include:  Doing strength training two times a week, such as: ? Lifting weights. ? Using resistance bands. ? Push-ups. ? Sit-ups. ? Yoga.  Doing a certain intensity of exercise for a given amount of time. Choose from these options: ? A total of 150 minutes of moderate-intensity exercise every week. ? A total of 75 minutes of vigorous-intensity exercise every week. ? A mix of moderate-intensity and vigorous-intensity exercise every week. Children, pregnant women, people who have not exercised  regularly, people who are overweight, and older adults may need to talk with a health care provider about what activities are safe to do. If you have a medical condition, be sure to talk with your health care provider before you start a new exercise program. What are some exercise ideas? Moderate-intensity exercise ideas include:  Walking 1 mile (1.6 km) in about 15 minutes.  Biking.  Hiking.  Golfing.  Dancing.  Water aerobics. Vigorous-intensity exercise ideas include:  Walking 4.5 miles (7.2 km) or more in about 1 hour.  Jogging or running 5 miles (8 km) in about 1 hour.  Biking 10 miles (16.1 km) or more in about 1 hour.  Lap swimming.  Roller-skating or in-line skating.  Cross-country skiing.  Vigorous competitive sports, such as football, basketball, and soccer.  Jumping rope.  Aerobic dancing. What are some everyday activities that can help me to get exercise?  Yard work, such as: ? Pushing a lawn mower. ? Raking and bagging leaves.  Washing your car.  Pushing a stroller.  Shoveling snow.  Gardening.  Washing windows or floors. How can I be more active in my day-to-day activities?  Use stairs instead of an elevator.  Take a walk during your lunch break.  If you drive, park your car farther away from your work or school.  If you take public transportation, get off one stop early and walk the rest of the way.  Stand up or walk around during all of your indoor phone calls.  Get up, stretch, and walk around every 30 minutes throughout the day.  Enjoy exercise with a friend. Support to continue exercising will help you keep a regular routine of activity. What guidelines can   I follow while exercising?  Before you start a new exercise program, talk with your health care provider.  Do not exercise so much that you hurt yourself, feel dizzy, or get very short of breath.  Wear comfortable clothes and wear shoes with good support.  Drink plenty of  water while you exercise to prevent dehydration or heat stroke.  Work out until your breathing and your heartbeat get faster. Where to find more information  [place] Department of Health and Human Services: www.hhs.gov  Centers for Disease Control and Prevention (CDC): www.cdc.gov Summary  Exercising regularly is important. It will improve your overall fitness, flexibility, and endurance.  Regular exercise also will improve your overall health. It can help you control your weight, reduce stress, and improve your bone density.  Do not exercise so much that you hurt yourself, feel dizzy, or get very short of breath.  Before you start a new exercise program, talk with your health care provider. This information is not intended to replace advice given to you by your health care provider. Make sure you discuss any questions you have with your health care provider. Document Revised: 04/18/2017 Document Reviewed: 03/27/2017 Elsevier Patient Education  2020 [employer]   Budget-Friendly Healthy Eating There are many ways to save money at the grocery store and continue to eat healthy. You can be successful if you:  Plan meals according to your budget.  Make a grocery list and only purchase food according to your grocery list.  Prepare food yourself. What are tips for following this plan?  Reading food labels  Compare food labels between brand name foods and the store brand. Often the nutritional value is the same, but the store brand is lower cost.  Look for products that do not have added sugar, fat, or salt (sodium). These often cost the same but are healthier for you. Products may be labeled as: ? Sugar-free. ? Nonfat. ? Low-fat. ? Sodium-free. ? Low-sodium.  Look for lean ground beef labeled as at least 92% lean and 8% fat. Shopping  Buy only the items on your grocery list and go only to the areas of the store that have the items on your list.  Use coupons only for foods  and brands you normally buy. Avoid buying items you wouldn't normally buy simply because they are on sale.  Check online and in newspapers for weekly deals.  Buy healthy items from the bulk bins when available, such as herbs, spices, flour, pasta, nuts, and dried fruit.  Buy fruits and vegetables that are in season. Prices are usually lower on in-season produce.  Look at the unit price on the price tag. Use it to compare different brands and sizes to find out which item is the best deal.  Choose healthy items that are often low-cost, such as carrots, potatoes, apples, bananas, and oranges. Dried or canned beans are a low-cost protein source.  Buy in bulk and freeze extra food. Items you can buy in bulk include meats, fish, poultry, frozen fruits, and frozen vegetables.  Avoid buying "ready-to-eat" foods, such as pre-cut fruits and vegetables and pre-made salads.  If possible, shop around to discover where you can find the best prices. Consider other retailers such as dollar stores, larger wholesale stores, local fruit and vegetable stands, and farmers markets.  Do not shop when you are hungry. If you shop while hungry, it may be hard to stick to your list and budget.  Resist impulse buying. Use your grocery list as   your official plan for the week.  Buy a variety of vegetables and fruits by purchasing fresh, frozen, and canned items.  Look at the top and bottom shelves for deals. Foods at eye level (eye level of an adult or child) are usually more expensive.  Be efficient with your time when shopping. The more time you spend at the store, the more money you are likely to spend.  To save money when choosing more expensive foods like meats and dairy: ? Choose cheaper cuts of meat, such as bone-in chicken thighs and drumsticks instead of skinless and boneless chicken. When you are ready to prepare the chicken, you can remove the skin yourself to make it healthier. ? Choose lean meats like  chicken or turkey instead of beef. ? Choose canned seafood, such as tuna, salmon, or sardines. ? Buy eggs as a low-cost source of protein. ? Buy dried beans and peas, such as lentils, split peas, or kidney beans instead of meats. Dried beans and peas are a good alternative source of protein. ? Buy the larger tubs of yogurt instead of individual-sized containers.  Choose water instead of sodas and other sweetened beverages.  Avoid buying chips, cookies, and other "junk food." These items are usually expensive and not healthy. Cooking  Make extra food and freeze the extras in meal-sized containers or in individual portions for fast meals and snacks.  Pre-cook on days when you have extra time to prepare meals in advance. You can keep these meals in the fridge or freezer and reheat for a quick meal.  When you come home from the grocery store, wash, peel, and cut fruits and vegetables so they are ready to use and eat. This will help reduce food waste. Meal planning  Do not eat out or get fast food. Prepare food at home.  Make a grocery list and make sure to bring it with you to the store. If you have a smart phone, you could use your phone to create your shopping list.  Plan meals and snacks according to a grocery list and budget you create.  Use leftovers in your meal plan for the week.  Look for recipes where you can cook once and make enough food for two meals.  Include budget-friendly meals like stews, casseroles, and stir-fry dishes.  Try some meatless meals or try "no cook" meals like salads.  Make sure that half your plate is filled with fruits or vegetables. Choose from fresh, frozen, or canned fruits and vegetables. If eating canned, remember to rinse them before eating. This will remove any excess salt added for packaging. Summary  Eating healthy on a budget is possible if you plan your meals according to your budget, purchase according to your budget and grocery list, and  prepare food yourself.  Tips for buying more food on a limited budget include buying generic brands, using coupons only for foods you normally buy, and buying healthy items from the bulk bins when available.  Tips for buying cheaper food to replace expensive food include choosing cheaper, lean cuts of meat, and buying dried beans and peas. This information is not intended to replace advice given to you by your health care provider. Make sure you discuss any questions you have with your health care provider. Document Revised: 05/07/2017 Document Reviewed: 05/07/2017 Elsevier Patient Education  2020 [employer].   Bone Health Bones protect organs, store calcium, anchor muscles, and support the whole body. Keeping your bones strong is important, especially as you   get older. You can take actions to help keep your bones strong and healthy. Why is keeping my bones healthy important?  Keeping your bones healthy is important because your body constantly replaces bone cells. Cells get old, and new cells take their place. As we age, we lose bone cells because the body may not be able to make enough new cells to replace the old cells. The amount of bone cells and bone tissue you have is referred to as bone mass. The higher your bone mass, the stronger your bones. The aging process leads to an overall loss of bone mass in the body, which can increase the likelihood of:  Joint pain and stiffness.  Broken bones.  A condition in which the bones become weak and brittle (osteoporosis). A large decline in bone mass occurs in older adults. In women, it occurs about the time of menopause. What actions can I take to keep my bones healthy? Good health habits are important for maintaining healthy bones. This includes eating nutritious foods and exercising regularly. To have healthy bones, you need to get enough of the right minerals and vitamins. Most nutrition experts recommend getting these nutrients from the  foods that you eat. In some cases, taking supplements may also be recommended. Doing certain types of exercise is also important for bone health. What are the nutritional recommendations for healthy bones?  Eating a well-balanced diet with plenty of calcium and vitamin D will help to protect your bones. Nutritional recommendations vary from person to person. Ask your health care provider what is healthy for you. Here are some general guidelines. Get enough calcium Calcium is the most important (essential) mineral for bone health. Most people can get enough calcium from their diet, but supplements may be recommended for people who are at risk for osteoporosis. Good sources of calcium include:  Dairy products, such as low-fat or nonfat milk, cheese, and yogurt.  Dark green leafy vegetables, such as bok choy and broccoli.  Calcium-fortified foods, such as orange juice, cereal, bread, soy beverages, and tofu products.  Nuts, such as almonds. Follow these recommended amounts for daily calcium intake:  Children, age 1-3: 700 mg.  Children, age 4-8: 1,000 mg.  Children, age 9-13: 1,300 mg.  Teens, age 14-18: 1,300 mg.  Adults, age 19-50: 1,000 mg.  Adults, age 51-70: ? Men: 1,000 mg. ? Women: 1,200 mg.  Adults, age 71 or older: 1,200 mg.  Pregnant and breastfeeding females: ? Teens: 1,300 mg. ? Adults: 1,000 mg. Get enough vitamin D Vitamin D is the most essential vitamin for bone health. It helps the body absorb calcium. Sunlight stimulates the skin to make vitamin D, so be sure to get enough sunlight. If you live in a cold climate or you do not get outside often, your health care provider may recommend that you take vitamin D supplements. Good sources of vitamin D in your diet include:  Egg yolks.  Saltwater fish.  Milk and cereal fortified with vitamin D. Follow these recommended amounts for daily vitamin D intake:  Children and teens, age 1-18: 600 international  units.  Adults, age 50 or younger: 400-800 international units.  Adults, age 51 or older: 800-1,000 international units. Get other important nutrients Other nutrients that are important for bone health include:  Phosphorus. This mineral is found in meat, poultry, dairy foods, nuts, and legumes. The recommended daily intake for adult men and adult women is 700 mg.  Magnesium. This mineral is found in seeds, nuts, dark   green vegetables, and legumes. The recommended daily intake for adult men is 400-420 mg. For adult women, it is 310-320 mg.  Vitamin K. This vitamin is found in green leafy vegetables. The recommended daily intake is 120 mg for adult men and 90 mg for adult women. What type of physical activity is best for building and maintaining healthy bones? Weight-bearing and strength-building activities are important for building and maintaining healthy bones. Weight-bearing activities cause muscles and bones to work against gravity. Strength-building activities increase the strength of the muscles that support bones. Weight-bearing and muscle-building activities include:  Walking and hiking.  Jogging and running.  Dancing.  Gym exercises.  Lifting weights.  Tennis and racquetball.  Climbing stairs.  Aerobics. Adults should get at least 30 minutes of moderate physical activity on most days. Children should get at least 60 minutes of moderate physical activity on most days. Ask your health care provider what type of exercise is best for you. How can I find out if my bone mass is low? Bone mass can be measured with an X-ray test called a bone mineral density (BMD) test. This test is recommended for all women who are age 65 or older. It may also be recommended for:  Men who are age 70 or older.  People who are at risk for osteoporosis because of: ? Having bones that break easily. ? Having a long-term disease that weakens bones, such as kidney disease or rheumatoid  arthritis. ? Having menopause earlier than normal. ? Taking medicine that weakens bones, such as steroids, thyroid hormones, or hormone treatment for breast cancer or prostate cancer. ? Smoking. ? Drinking three or more alcoholic drinks a day. If you find that you have a low bone mass, you may be able to prevent osteoporosis or further bone loss by changing your diet and lifestyle. Where can I find more information? For more information, check out the following websites:  National Osteoporosis Foundation: www.nof.org/patients  National Institutes of Health: www.bones.nih.gov  International Osteoporosis Foundation: www.iofbonehealth.org Summary  The aging process leads to an overall loss of bone mass in the body, which can increase the likelihood of broken bones and osteoporosis.  Eating a well-balanced diet with plenty of calcium and vitamin D will help to protect your bones.  Weight-bearing and strength-building activities are also important for building and maintaining strong bones.  Bone mass can be measured with an X-ray test called a bone mineral density (BMD) test. This information is not intended to replace advice given to you by your health care provider. Make sure you discuss any questions you have with your health care provider. Document Revised: 06/02/2017 Document Reviewed: 06/02/2017 Elsevier Patient Education  2020 [employer].   

## 2020-03-24 NOTE — Progress Notes (Signed)
Gynecology Annual Exam  PCP: Margo Common, [place]  Chief Complaint:  Chief Complaint  Patient presents with  . Gynecologic Exam    annual exam    History of Present Illness: Patient is a 54 y.o. G2P2 presents for annual exam. The patient has no complaints today.   LMP: Patient's last menstrual period was 07/23/2003. Reports she is postmenopausal. No periods with IUD in place and none after IUD was removed when she was 24. Has some vasomotor symptoms. Manageable right now.   The patient is sexually active. She currently uses none for contraception. She denies dyspareunia.  The patient does perform self breast exams.  There is no notable family history of breast or ovarian cancer in her family.  The patient has regular exercise: yes.    The patient denies current symptoms of depression.    Review of Systems: ROS  Past Medical History:  Patient Active Problem List   Diagnosis Date Noted  . Carpal tunnel syndrome 07/08/2017  . DDD (degenerative disc disease), cervical 07/08/2017  . Thoracic back pain 07/08/2017  . 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 Allergic rhinitis 03/01/2015  . Absolute anemia 03/01/2015  . Anxiety 03/01/2015    Failed Wellbutrin, Zoloft,  Lexapro and Buspar.   . Arthritis 03/01/2015  . Body mass index (BMI) of 24.0-24.9 in adult 03/01/2015  . Abnormal C-reactive protein 03/01/2015  . Elevated lipase 03/01/2015  . Abnormal liver enzymes 03/01/2015  . Fibrositis 03/01/2015  . BP (high blood pressure) 03/01/2015  . Adaptive colitis 03/01/2015  . Ache in joint 03/01/2015  . Hypoglycemia 03/01/2015  . Abdominal pain, lower 03/01/2015  . Headache, migraine 03/01/2015  . IBS (irritable bowel syndrome) 03/01/2015  . Flu vaccine need 03/01/2015  . Fibromyalgia 03/01/2015  . Hypertension 12/12/2014  . Hypothyroidism 11/28/2014    Past Surgical History:  Past Surgical History:  Procedure Laterality Date  . CHOLECYSTECTOMY  06/04/2007    Gynecologic  History:  Patient's last menstrual period was 07/23/2003. Last Pap: Results were: unknown Last mammogram: 2020 Results were: BI-RAD I  Obstetric History: G2P2  Family History:  Family History  Problem Relation Age of Onset  . Hypertension Mother   . Hyperlipidemia Mother   . Thyroid disease Mother   . Hypertension Father   . Hyperlipidemia Father   . Diabetes Sister   . Hypertension Sister   . Thyroid disease Sister   . Thyroid disease Maternal Grandmother   . Breast cancer Other     Social History:  Social History   Socioeconomic History  . Marital status: Married    Spouse name: Not on file  . Number of children: 2  . Years of education: H/S  . Highest education level: Not on file  Occupational History  . Occupation: Full-Time  Tobacco Use  . Smoking status: Never Smoker  . Smokeless tobacco: Never Used  Substance and Sexual Activity  . Alcohol use: Yes  . Drug use: No  . Sexual activity: Not on file  Other Topics Concern  . Not on file  Social History Narrative  . Not on file   Social Determinants of Health   Financial Resource Strain:   . Difficulty of Paying Living Expenses: Not on file  Food Insecurity:   . Worried About Charity fundraiser in the Last Year: Not on file  . Ran Out of Food in the Last Year: Not on file  Transportation Needs:   . Lack of Transportation (Medical): Not on file  .  Lack of Transportation (Non-Medical): Not on file  Physical Activity:   . Days of Exercise per Week: Not on file  . Minutes of Exercise per Session: Not on file  Stress:   . Feeling of Stress : Not on file  Social Connections:   . Frequency of Communication with Friends and Family: Not on file  . Frequency of Social Gatherings with Friends and Family: Not on file  . Attends Religious Services: Not on file  . Active Member of Clubs or Organizations: Not on file  . Attends Archivist Meetings: Not on file  . Marital Status: Not on file  Intimate  Partner Violence:   . Fear of Current or Ex-Partner: Not on file  . Emotionally Abused: Not on file  . Physically Abused: Not on file  . Sexually Abused: Not on file    Allergies:  Allergies  Allergen Reactions  . Other     Other reaction(s): Headache, Other Other reaction(s): Headache, Other  . Penicillins Rash    Medications: Prior to Admission medications   Medication Sig Start Date End Date Taking? Authorizing Provider  doxepin (SINEQUAN) 10 MG capsule doxepin 10 mg capsule  1 to 2 capsules in pm as needed for sleep Patient not taking: Reported on 03/24/2020    [provider]  fluticasone Asencion Islam) 50 MCG/ACT nasal spray  06/24/19   [provider]  hydrochlorothiazide (HYDRODIURIL) 12.5 MG tablet Take 1 tablet (12.5 mg total) by mouth daily. 07/02/19   Chrismon, Vickki Muff, PA  metoprolol succinate (TOPROL-XL) 25 MG 24 hr tablet Take 1 tablet (25 mg total) by mouth daily. 07/02/19   Chrismon, Vickki Muff, PA  NARCAN 4 MG/0.1ML LIQD nasal spray kit USE 1 SPRAY IN 1 NOSTRIL EVERY 2 MINUTES UNTIL RESPONSIVE/EMS ARRIVES Patient not taking: Reported on 03/24/2020 04/16/19   [provider]  oxyCODONE (OXYCONTIN) 15 mg 12 hr tablet Take 15 mg by mouth every 12 (twelve) hours. Patient not taking: Reported on 03/24/2020    [provider]  oxyCODONE-acetaminophen (PERCOCET) 7.5-325 MG tablet PLEASE SEE ATTACHED FOR DETAILED DIRECTIONS Patient not taking: Reported on 03/24/2020 05/11/19   [provider]  Probiotic CAPS Take 1 capsule by mouth daily.    [provider]  thyroid [facility] THYROID) 30 MG tablet TAKE 1 TABLET DAILY 07/02/19   Chrismon, Vickki Muff, PA    Physical Exam Vitals: Blood pressure 110/70, height _0  (1.651 m), weight 165 lb 9.6 oz (75.1 kg), last menstrual period 07/23/2003.  General: NAD HEENT: normocephalic, anicteric Thyroid: no enlargement, no palpable nodules Pulmonary: No increased work of breathing,  CTAB Cardiovascular: RRR, distal pulses 2+ Breast: Breast symmetrical, no tenderness, no palpable nodules or masses, no skin or nipple retraction present, no nipple discharge.  No axillary or supraclavicular lymphadenopathy. Abdomen: NABS, soft, non-tender, non-distended.  Umbilicus without lesions.  No hepatomegaly, splenomegaly or masses palpable. No evidence of hernia  Genitourinary:  External: Normal external female genitalia.  Normal urethral meatus, normal Bartholin's and Skene's glands.    Vagina: Normal vaginal mucosa, no evidence of prolapse.    Cervix: Grossly normal in appearance, no bleeding  Uterus: Non-enlarged, mobile, normal contour.  No CMT  Adnexa: ovaries non-enlarged, no adnexal masses  Rectal: deferred  Lymphatic: no evidence of inguinal lymphadenopathy Extremities: no edema, erythema, or tenderness Neurologic: Grossly intact Psychiatric: mood appropriate, affect full  Female chaperone present for pelvic and breast  portions of the physical exam    Assessment: 54 y.o. G2P2 routine annual  exam  Plan: Problem List Items Addressed This Visit    None    Visit Diagnoses    Encounter for annual routine gynecological examination    -  Primary   Health maintenance examination       Breast cancer screening by mammogram       Relevant Orders   MM 3D SCREEN BREAST BILATERAL   Cervical cancer screening       Relevant Orders   Cytology - PAP   Encounter for gynecological examination without abnormal finding          1) Mammogram - recommend yearly screening mammogram.  Mammogram Was ordered today  2) STI screening  was offered and declined  3) ASCCP guidelines and rational discussed.  Patient opts for every 5 years screening interval  4) Contraception - the patient is currently using  none.  She is happy with her current form of contraception and plans to continue  5) Colonoscopy -last completed in 2014  6) Routine healthcare maintenance including cholesterol,  diabetes screening discussed managed by PCP  7) Return in about 1 year (around 03/24/2021) for annual.   [place], [place] 03/24/2020, 10:25 AM

## 2020-03-28 LAB — CYTOLOGY - PAP
Comment: NEGATIVE
Diagnosis: NEGATIVE
High risk HPV: NEGATIVE

## 2020-05-25 DIAGNOSIS — R5383 Other fatigue: Secondary | ICD-10-CM | POA: Diagnosis not present

## 2020-05-25 DIAGNOSIS — M159 Polyosteoarthritis, unspecified: Secondary | ICD-10-CM | POA: Diagnosis not present

## 2020-06-05 ENCOUNTER — Ambulatory Visit: Payer: BC Managed Care – PPO

## 2020-06-09 DIAGNOSIS — Z20822 Contact with and (suspected) exposure to covid-19: Secondary | ICD-10-CM | POA: Diagnosis not present

## 2020-06-14 DIAGNOSIS — Z8616 Personal history of COVID-19: Secondary | ICD-10-CM | POA: Diagnosis not present

## 2020-06-14 DIAGNOSIS — U071 COVID-19: Secondary | ICD-10-CM | POA: Diagnosis not present

## 2020-06-20 ENCOUNTER — Ambulatory Visit
Admission: RE | Admit: 2020-06-20 | Discharge: 2020-06-20 | Disposition: A | Discharge status: Home / Self Care | Payer: BC Managed Care – PPO | Source: Ambulatory Visit | Attending: Obstetrics and Gynecology | Admitting: Obstetrics and Gynecology

## 2020-06-20 ENCOUNTER — Other Ambulatory Visit: Payer: Self-pay

## 2020-06-20 DIAGNOSIS — Z1231 Encounter for screening mammogram for malignant neoplasm of breast: Secondary | ICD-10-CM | POA: Diagnosis not present

## 2020-06-27 ENCOUNTER — Other Ambulatory Visit: Payer: Self-pay | Admitting: Family Medicine

## 2020-06-27 DIAGNOSIS — I10 Essential (primary) hypertension: Secondary | ICD-10-CM

## 2020-07-18 ENCOUNTER — Other Ambulatory Visit: Payer: Self-pay

## 2020-07-18 ENCOUNTER — Ambulatory Visit (INDEPENDENT_AMBULATORY_CARE_PROVIDER_SITE_OTHER): Payer: BC Managed Care – PPO | Admitting: Dermatology

## 2020-07-18 DIAGNOSIS — Z1283 Encounter for screening for malignant neoplasm of skin: Secondary | ICD-10-CM

## 2020-07-18 DIAGNOSIS — Z86018 Personal history of other benign neoplasm: Secondary | ICD-10-CM | POA: Diagnosis not present

## 2020-07-18 DIAGNOSIS — L821 Other seborrheic keratosis: Secondary | ICD-10-CM

## 2020-07-18 DIAGNOSIS — D2272 Melanocytic nevi of left lower limb, including hip: Secondary | ICD-10-CM

## 2020-07-18 DIAGNOSIS — L813 Cafe au lait spots: Secondary | ICD-10-CM

## 2020-07-18 DIAGNOSIS — L578 Other skin changes due to chronic exposure to nonionizing radiation: Secondary | ICD-10-CM | POA: Diagnosis not present

## 2020-07-18 DIAGNOSIS — L820 Inflamed seborrheic keratosis: Secondary | ICD-10-CM | POA: Diagnosis not present

## 2020-07-18 DIAGNOSIS — L814 Other melanin hyperpigmentation: Secondary | ICD-10-CM

## 2020-07-18 DIAGNOSIS — D229 Melanocytic nevi, unspecified: Secondary | ICD-10-CM

## 2020-07-18 NOTE — Progress Notes (Signed)
   Follow-Up Visit   Subjective  Tami Finley is a 55 y.o. female who presents for the following: TBSE (Patient here for full body skin exam and skin cancer screening. Patient with hx of dysplastic nevus. Patient does have a spot at chest that has been there a few months, she has scratched it off, it does itch and bleed when she scratches it. ).    The following portions of the chart were reviewed this encounter and updated as appropriate:       Review of Systems:  No other skin or systemic complaints except as noted in HPI or Assessment and Plan.  Objective  Well appearing patient in no apparent distress; mood and affect are within normal limits.  A full examination was performed including scalp, head, eyes, ears, nose, lips, neck, chest, axillae, abdomen, back, buttocks, bilateral upper extremities, bilateral lower extremities, hands, feet, fingers, toes, fingernails, and toenails. All findings within normal limits unless otherwise noted below.  Objective  sternum x 1: Erythematous keratotic or waxy stuck-on papule    Objective  Left Posterior Thigh: 0.2cm medium dark brown macule at left post thigh 0.35cm med dark brown macule at left pretibia 0.3cm med brown papule at left great toe 0.4cm medium brown papule at left 3rd toe 0.4cm flesh papule at right nasal ala Brown macules at back   Images           Assessment & Plan  Inflamed seborrheic keratosis sternum x 1  Destruction of lesion - sternum x 1  Destruction method: cryotherapy   Informed consent: discussed and consent obtained   Lesion destroyed using liquid nitrogen: Yes   Region frozen until ice ball extended beyond lesion: Yes   Outcome: patient tolerated procedure well with no complications   Post-procedure details: wound care instructions given    Nevus Left Posterior Thigh  Benign-appearing.  Observation.  Call clinic for new or changing lesions.  Recommend daily use of broad spectrum spf 30+  sunscreen to sun-exposed areas.    Lentigines - Scattered tan macules - Due to sun exposure - Benign-appering, observe - Recommend daily broad spectrum sunscreen SPF 30+ to sun-exposed areas, reapply every 2 hours as needed. - Call for any changes  Seborrheic Keratoses - Stuck-on, waxy, tan-brown papules and plaques  - Discussed benign etiology and prognosis. - Observe - Call for any changes  Melanocytic Nevi - Tan-brown and/or pink-flesh-colored symmetric macules and papules - Benign appearing on exam today - Observation - Call clinic for new or changing moles - Recommend daily use of broad spectrum spf 30+ sunscreen to sun-exposed areas.   Hemangiomas - Red papules - Discussed benign nature - Observe - Call for any changes  Actinic Damage - Chronic, secondary to cumulative UV/sun exposure - diffuse scaly erythematous macules with underlying dyspigmentation - Recommend daily broad spectrum sunscreen SPF 30+ to sun-exposed areas, reapply every 2 hours as needed.  - Call for new or changing lesions.  Skin cancer screening performed today.  Cafe au Lait  - Tan patch at right elbow - Genetic - Benign, observe - Call for any changes  Return in about 1 year (around 07/18/2021) for TBSE.  Graciella Belton, RMA, am acting as scribe for Brendolyn Patty, MD . Documentation: I have reviewed the above documentation for accuracy and completeness, and I agree with the above.  Brendolyn Patty MD

## 2020-07-18 NOTE — Patient Instructions (Signed)

## 2020-07-21 ENCOUNTER — Other Ambulatory Visit: Payer: Self-pay | Admitting: Family Medicine

## 2020-07-21 DIAGNOSIS — E039 Hypothyroidism, unspecified: Secondary | ICD-10-CM

## 2020-09-10 ENCOUNTER — Encounter: Payer: Self-pay | Admitting: Family Medicine

## 2020-09-11 ENCOUNTER — Other Ambulatory Visit: Payer: Self-pay

## 2020-09-11 DIAGNOSIS — I10 Essential (primary) hypertension: Secondary | ICD-10-CM

## 2020-09-11 DIAGNOSIS — E039 Hypothyroidism, unspecified: Secondary | ICD-10-CM

## 2020-09-11 MED ORDER — METOPROLOL SUCCINATE ER 25 MG PO TB24: 1.0000 | ORAL_TABLET | Freq: Every day | ORAL | 0 refills | Status: DC

## 2020-09-11 MED ORDER — THYROID 30 MG PO TABS: ORAL_TABLET | ORAL | 0 refills | Status: DC

## 2020-09-11 MED ORDER — HYDROCHLOROTHIAZIDE 12.5 MG PO TABS: 12.5000 mg | ORAL_TABLET | Freq: Every day | ORAL | 0 refills | Status: DC

## 2020-09-14 ENCOUNTER — Other Ambulatory Visit: Payer: Self-pay | Admitting: Family Medicine

## 2020-09-14 DIAGNOSIS — I10 Essential (primary) hypertension: Secondary | ICD-10-CM

## 2020-09-14 NOTE — Telephone Encounter (Signed)
Requested medications are due for refill today.  yes  Requested medications are on the active medications list.  yes  Last refill. 09/11/2020  Future visit scheduled.   no  Notes to clinic.  Patient already given courtesy refill.

## 2020-10-03 ENCOUNTER — Other Ambulatory Visit: Payer: Self-pay | Admitting: Family Medicine

## 2020-10-03 DIAGNOSIS — I10 Essential (primary) hypertension: Secondary | ICD-10-CM

## 2020-10-03 NOTE — Telephone Encounter (Signed)
   Notes to clinic: courtesy refill has been given and appt has not been scheduled    Requested Prescriptions  Pending Prescriptions Disp Refills   hydrochlorothiazide (HYDRODIURIL) 12.5 MG tablet [Pharmacy Med Name: HYDROCHLOROTHIAZIDE 12.5 MG TB] 30 tablet 0    Sig: TAKE 1 TABLET BY MOUTH EVERY DAY      Cardiovascular: Diuretics - Thiazide Failed - 10/03/2020 11:30 AM      Failed - Ca in normal range and within 360 days    Calcium  Date Value Ref Range Status  07/12/2019 10.2 8.7 - 10.2 mg/dL Final  04/01/2018 9.8  Final          Failed - Cr in normal range and within 360 days    Creatinine, Ser  Date Value Ref Range Status  07/12/2019 0.76 0.57 - 1.00 mg/dL Final          Failed - K in normal range and within 360 days    Potassium  Date Value Ref Range Status  07/12/2019 4.2 3.5 - 5.2 mmol/L Final          Failed - Na in normal range and within 360 days    Sodium  Date Value Ref Range Status  07/12/2019 139 134 - 144 mmol/L Final          Failed - Valid encounter within last 6 months    Recent Outpatient Visits           1 year ago Essential hypertension   [place], Vickki Muff, PA-C   2 years ago Encounter for annual physical examination excluding gynecological examination in a patient older than 17 years   [employer], Vickki Muff, PA-C   3 years ago Hypothyroidism, unspecified type   [employer], Vickki Muff, PA-C   4 years ago Need for influenza vaccination   [employer], Vickki Muff, PA-C   5 years ago Essential hypertension   [place], MD                Passed - Last BP in normal range    BP Readings from Last 1 Encounters:  03/24/20 110/70

## 2020-10-03 NOTE — Telephone Encounter (Signed)
  Notes to clinic:  courtesy refill already given  No appt has been scheduled   Requested Prescriptions  Pending Prescriptions Disp Refills   metoprolol succinate (TOPROL-XL) 25 MG 24 hr tablet [Pharmacy Med Name: METOPROLOL SUCC ER 25 MG TAB] 30 tablet 0    Sig: Take 1 tablet (25 mg total) by mouth daily.      Cardiovascular:  Beta Blockers Failed - 10/03/2020 11:35 AM      Failed - Valid encounter within last 6 months    Recent Outpatient Visits           1 year ago Essential hypertension   [place], Vickki Muff, PA-C   2 years ago Encounter for annual physical examination excluding gynecological examination in a patient older than 17 years   [employer], Vickki Muff, PA-C   3 years ago Hypothyroidism, unspecified type   [employer], Vickki Muff, PA-C   4 years ago Need for influenza vaccination   [employer], Vickki Muff, PA-C   5 years ago Essential hypertension   [place], MD                Passed - Last BP in normal range    BP Readings from Last 1 Encounters:  03/24/20 110/70          Passed - Last Heart Rate in normal range    Pulse Readings from Last 1 Encounters:  08/17/19 90

## 2020-10-08 ENCOUNTER — Other Ambulatory Visit: Payer: Self-pay | Admitting: Family Medicine

## 2020-10-08 DIAGNOSIS — E039 Hypothyroidism, unspecified: Secondary | ICD-10-CM

## 2020-10-08 NOTE — Telephone Encounter (Signed)
Last RF 09/11/20 #30 courtesy RF Overdue for appt- sent pt MyChart message to call office and schedule.  Requested Prescriptions  Pending Prescriptions Disp Refills  . thyroid (ARMOUR THYROID) 30 MG tablet [Pharmacy Med Name: ARMOUR THYROID 30 MG TABLET] 30 tablet 0    Sig: TAKE 1 TABLET BY MOUTH EVERY DAY     Endocrinology:  Hypothyroid Agents Failed - 10/08/2020  9:43 AM      Failed - TSH needs to be rechecked within 3 months after an abnormal result. Refill until TSH is due.      Failed - TSH in normal range and within 360 days    TSH  Date Value Ref Range Status  07/12/2019 1.050 0.450 - 4.500 uIU/mL Final         Failed - Valid encounter within last 12 months    Recent Outpatient Visits          1 year ago Essential hypertension   [place], Vickki Muff, PA-C   2 years ago Encounter for annual physical examination excluding gynecological examination in a patient older than 17 years   [place], PA-C   3 years ago Hypothyroidism, unspecified type   [employer], Vickki Muff, PA-C   4 years ago Need for influenza vaccination   [employer], Vickki Muff, PA-C   5 years ago Essential hypertension   [facility] Margarita Rana, MD

## 2020-10-24 ENCOUNTER — Encounter: Payer: Self-pay | Admitting: Family Medicine

## 2020-10-24 ENCOUNTER — Other Ambulatory Visit: Payer: Self-pay | Admitting: Family Medicine

## 2020-10-24 DIAGNOSIS — I10 Essential (primary) hypertension: Secondary | ICD-10-CM

## 2020-10-24 DIAGNOSIS — E039 Hypothyroidism, unspecified: Secondary | ICD-10-CM

## 2020-10-24 MED ORDER — THYROID 30 MG PO TABS: ORAL_TABLET | ORAL | 0 refills | Status: DC

## 2020-10-24 MED ORDER — METOPROLOL SUCCINATE ER 25 MG PO TB24: 1.0000 | ORAL_TABLET | Freq: Every day | ORAL | 0 refills | Status: DC

## 2020-10-24 MED ORDER — HYDROCHLOROTHIAZIDE 12.5 MG PO TABS: 12.5000 mg | ORAL_TABLET | Freq: Every day | ORAL | 0 refills | Status: DC

## 2020-10-25 MED ORDER — HYDROCHLOROTHIAZIDE 12.5 MG PO TABS: 12.5000 mg | ORAL_TABLET | Freq: Every day | ORAL | 0 refills | Status: DC

## 2020-10-25 MED ORDER — THYROID 30 MG PO TABS: ORAL_TABLET | ORAL | 0 refills | Status: DC

## 2020-10-25 MED ORDER — METOPROLOL SUCCINATE ER 25 MG PO TB24: 1.0000 | ORAL_TABLET | Freq: Every day | ORAL | 0 refills | Status: DC

## 2020-10-26 ENCOUNTER — Ambulatory Visit: Payer: Self-pay | Admitting: Family Medicine

## 2020-10-27 ENCOUNTER — Other Ambulatory Visit: Payer: Self-pay

## 2020-10-27 ENCOUNTER — Ambulatory Visit (INDEPENDENT_AMBULATORY_CARE_PROVIDER_SITE_OTHER): Payer: Self-pay | Admitting: Family Medicine

## 2020-10-27 ENCOUNTER — Encounter: Payer: Self-pay | Admitting: Family Medicine

## 2020-10-27 VITALS — BP 125/75 | HR 88 | Temp 98.3°F | Resp 16 | Ht 65.0 in | Wt 170.0 lb

## 2020-10-27 DIAGNOSIS — E039 Hypothyroidism, unspecified: Secondary | ICD-10-CM

## 2020-10-27 DIAGNOSIS — E785 Hyperlipidemia, unspecified: Secondary | ICD-10-CM

## 2020-10-27 DIAGNOSIS — I10 Essential (primary) hypertension: Secondary | ICD-10-CM

## 2020-10-27 NOTE — Patient Instructions (Incomplete)

## 2020-10-27 NOTE — Progress Notes (Signed)
Established patient visit   Patient: Tami Finley   DOB: January 04, 1966   55 y.o. Female  MRN: [id number] Visit Date: 10/27/2020  Today's healthcare provider: Vernie Murders, PA-C   Chief Complaint  Patient presents with   Hypertension   Hypothyroidism   Subjective    HPI  Hypertension, follow-up  BP Readings from Last 3 Encounters:  10/27/20 125/75  03/24/20 110/70  08/17/19 (!) 173/84   Wt Readings from Last 3 Encounters:  10/27/20 170 lb (77.1 kg)  03/24/20 165 lb 9.6 oz (75.1 kg)  08/17/19 163 lb (73.9 kg)     She was last seen for hypertension 1 years ago.  BP at that visit was 134/83. Management since that visit includes no changes.  She reports excellent compliance with treatment. She is not having side effects.  She is following a Regular diet. She is not exercising. She does not smoke.  Use of agents associated with hypertension: none.   Outside blood pressures are STABLE. Symptoms: No chest pain No chest pressure  No palpitations No syncope  No dyspnea No orthopnea  No paroxysmal nocturnal dyspnea No lower extremity edema   Pertinent labs: Lab Results  Component Value Date   CHOL 241 (H) 07/12/2019   HDL 56 07/12/2019   LDLCALC 142 (H) 07/12/2019   TRIG 241 (H) 07/12/2019   CHOLHDL 4.3 07/12/2019   Lab Results  Component Value Date   NA 139 07/12/2019   K 4.2 07/12/2019   CREATININE 0.76 07/12/2019   GFRNONAA 90 07/12/2019   GFRAA 104 07/12/2019   GLUCOSE 93 07/12/2019     The 10-year ASCVD risk score Mikey Bussing DC Jr., et al., 2013) is: 2.6%   ---------------------------------------------------------------------------------------------------  Hypothyroid, follow-up  Lab Results  Component Value Date   TSH 1.050 07/12/2019   TSH 1.16 04/01/2018   TSH 0.82 04/10/2017   T4TOTAL 7.0 07/12/2019   Wt Readings from Last 3 Encounters:  10/27/20 170 lb (77.1 kg)  03/24/20 165 lb 9.6 oz (75.1 kg)  08/17/19 163 lb (73.9 kg)    She was  last seen for hypothyroid 1 years ago.  Management since that visit includes no changes. She reports excellent compliance with treatment. She is not having side effects.   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   Allergic rhinitis 03/01/2015   Absolute anemia 03/01/2015   Anxiety 03/01/2015   Arthritis 03/01/2015   Body mass index (BMI) of 24.0-24.9 in adult 03/01/2015   Abnormal C-reactive protein 03/01/2015   Elevated lipase 03/01/2015   Abnormal liver enzymes 03/01/2015   Fibrositis 03/01/2015   BP (high blood pressure) 03/01/2015   Adaptive colitis 03/01/2015   Ache in joint 03/01/2015   Hypoglycemia 03/01/2015   Abdominal pain, lower 03/01/2015   Headache, migraine 03/01/2015   IBS (irritable bowel syndrome) 03/01/2015   Flu vaccine need 03/01/2015   Fibromyalgia 03/01/2015   Hypertension 12/12/2014   Hypothyroidism 11/28/2014   Past Surgical History:  Procedure Laterality Date   CHOLECYSTECTOMY  06/04/2007   Family History  Problem Relation Age of Onset   Hypertension Mother    Hyperlipidemia Mother    Thyroid disease Mother  Hypertension Father    Hyperlipidemia Father    Diabetes Sister    Hypertension Sister    Thyroid disease Sister    Thyroid disease Maternal Grandmother    Breast cancer Other     Social History   Tobacco Use   Smoking status: Never   Smokeless tobacco: Never  Substance Use Topics   Alcohol use: Yes   Drug use: No   Allergies  Allergen Reactions   Other     Other reaction(s): Headache, Other Other reaction(s): Headache,  Other   Penicillins Rash       Medications: Outpatient Medications Prior to Visit  Medication Sig   fluticasone (FLONASE) 50 MCG/ACT nasal spray    hydrochlorothiazide (HYDRODIURIL) 12.5 MG tablet Take 1 tablet (12.5 mg total) by mouth daily.   metoprolol succinate (TOPROL-XL) 25 MG 24 hr tablet Take 1 tablet (25 mg total) by mouth daily.   Probiotic CAPS Take 1 capsule by mouth daily.   thyroid (ARMOUR THYROID) 30 MG tablet TAKE 1 TABLET DAILY   [DISCONTINUED] doxepin (SINEQUAN) 10 MG capsule doxepin 10 mg capsule  1 to 2 capsules in pm as needed for sleep (Patient not taking: No sig reported)   [DISCONTINUED] NARCAN 4 MG/0.1ML LIQD nasal spray kit USE 1 SPRAY IN 1 NOSTRIL EVERY 2 MINUTES UNTIL RESPONSIVE/EMS ARRIVES (Patient not taking: No sig reported)   [DISCONTINUED] oxyCODONE (OXYCONTIN) 15 mg 12 hr tablet Take 15 mg by mouth every 12 (twelve) hours. (Patient not taking: No sig reported)   [DISCONTINUED] oxyCODONE-acetaminophen (PERCOCET) 7.5-325 MG tablet PLEASE SEE ATTACHED FOR DETAILED DIRECTIONS (Patient not taking: No sig reported)   No facility-administered medications prior to visit.    Review of Systems  Constitutional:  Negative for activity change, appetite change and fatigue.  Respiratory:  Negative for cough and shortness of breath.   Cardiovascular:  Negative for chest pain and palpitations.  Gastrointestinal:  Positive for diarrhea. Negative for abdominal pain, nausea and vomiting.  Endocrine: Negative for cold intolerance and heat intolerance.       Objective    BP 125/75 (BP Location: Right Arm, Patient Position: Sitting, Cuff Size: Normal)   Pulse 88   Temp 98.3 F (36.8 C) (Oral)   Resp 16   Ht _0  (1.651 m)   Wt 170 lb (77.1 kg)   LMP 07/23/2003 Comment: IUD.  LMP 12 yrs ago.  SpO2 98%   BMI 28.29 kg/m  BP Readings from Last 3 Encounters:  10/27/20 125/75  03/24/20 110/70  08/17/19 (!) 173/84   Wt Readings from Last 3 Encounters:  10/27/20  170 lb (77.1 kg)  03/24/20 165 lb 9.6 oz (75.1 kg)  08/17/19 163 lb (73.9 kg)       Physical Exam Constitutional:      General: She is not in acute distress.    Appearance: She is well-developed.  HENT:     Head: Normocephalic and atraumatic.     Right Ear: Hearing normal.     Left Ear: Hearing normal.     Nose: Nose normal.  Eyes:     General: Lids are normal. No scleral icterus.       Right eye: No discharge.        Left eye: No discharge.     Conjunctiva/sclera: Conjunctivae normal.  Cardiovascular:     Rate and Rhythm: Normal rate and regular rhythm.     Pulses: Normal pulses.     Heart sounds: Normal heart sounds.  Pulmonary:  Effort: Pulmonary effort is normal. No respiratory distress.     Breath sounds: Normal breath sounds.  Abdominal:     General: Bowel sounds are normal.     Palpations: Abdomen is soft.  Musculoskeletal:        General: Normal range of motion.     Cervical back: Neck supple.  Skin:    Findings: No lesion or rash.  Neurological:     Mental Status: She is alert and oriented to person, place, and time.  Psychiatric:        Speech: Speech normal.        Behavior: Behavior normal.        Thought Content: Thought content normal.      No results found for any visits on 10/27/20.  Assessment & Plan     1. Essential hypertension BP well controlled with HCTZ 12.5 mg qd and Metoprolol Succinate 25 mg qd. Recheck labs. No chest discomfort, palpitations, edema or dyspnea. - Comprehensive metabolic panel - TSH - CBC with Differential/Platelet - Lipid panel  2. Hypothyroidism, unspecified type Continues to take Armour Thyroid 30 mg qd. Notices some drop in energy with a little diarrhea. May need dosage adjustment. - Comprehensive metabolic panel - TSH - CBC with Differential/Platelet - T4  3. Hyperlipidemia, unspecified hyperlipidemia type . Lab Results  Component Value Date   CHOL 241 (H) 07/12/2019   HDL 56 07/12/2019   LDLCALC 142  (H) 07/12/2019   TRIG 241 (H) 07/12/2019   CHOLHDL 4.3 07/12/2019  Encouraged to follow a low fat diet and exercise regularly.  - Comprehensive metabolic panel - TSH - Lipid panel   No follow-ups on file.      I, Meztli Llanas, PA-C, have reviewed all documentation for this visit. The documentation on 10/27/20 for the exam, diagnosis, procedures, and orders are all accurate and complete.    Vernie Murders, PA-C  [employer] [phone] (phone) [phone] (fax)  [place]

## 2020-10-28 LAB — COMPREHENSIVE METABOLIC PANEL
ALT: 38 IU/L — ABNORMAL HIGH (ref 0–32)
AST: 32 IU/L (ref 0–40)
Albumin/Globulin Ratio: 1.9 (ref 1.2–2.2)
Albumin: 4.7 g/dL (ref 3.8–4.9)
Alkaline Phosphatase: 163 IU/L — ABNORMAL HIGH (ref 44–121)
BUN/Creatinine Ratio: 23 (ref 9–23)
BUN: 17 mg/dL (ref 6–24)
Bilirubin Total: 0.4 mg/dL (ref 0.0–1.2)
CO2: 22 mmol/L (ref 20–29)
Calcium: 9.8 mg/dL (ref 8.7–10.2)
Chloride: 100 mmol/L (ref 96–106)
Creatinine, Ser: 0.73 mg/dL (ref 0.57–1.00)
Globulin, Total: 2.5 g/dL (ref 1.5–4.5)
Glucose: 117 mg/dL — ABNORMAL HIGH (ref 65–99)
Potassium: 4.1 mmol/L (ref 3.5–5.2)
Sodium: 141 mmol/L (ref 134–144)
Total Protein: 7.2 g/dL (ref 6.0–8.5)
eGFR: 98 mL/min/{1.73_m2} (ref >59)

## 2020-10-28 LAB — LIPID PANEL
Chol/HDL Ratio: 4.2 ratio (ref 0.0–4.4)
Cholesterol, Total: 237 mg/dL — ABNORMAL HIGH (ref 100–199)
HDL: 56 mg/dL (ref >39)
LDL Chol Calc (NIH): 152 mg/dL — ABNORMAL HIGH (ref 0–99)
Triglycerides: 164 mg/dL — ABNORMAL HIGH (ref 0–149)
VLDL Cholesterol Cal: 29 mg/dL (ref 5–40)

## 2020-10-28 LAB — T4: T4, Total: 7.7 ug/dL (ref 4.5–12.0)

## 2020-10-28 LAB — CBC WITH DIFFERENTIAL/PLATELET
Basophils Absolute: 0 10*3/uL (ref 0.0–0.2)
Basos: 0 %
EOS (ABSOLUTE): 0 10*3/uL (ref 0.0–0.4)
Eos: 1 %
Hematocrit: 39.1 % (ref 34.0–46.6)
Hemoglobin: 13.2 g/dL (ref 11.1–15.9)
Immature Grans (Abs): 0 10*3/uL (ref 0.0–0.1)
Immature Granulocytes: 0 %
Lymphocytes Absolute: 1.2 10*3/uL (ref 0.7–3.1)
Lymphs: 26 %
MCH: 30.4 pg (ref 26.6–33.0)
MCHC: 33.8 g/dL (ref 31.5–35.7)
MCV: 90 fL (ref 79–97)
Monocytes Absolute: 0.4 10*3/uL (ref 0.1–0.9)
Monocytes: 8 %
Neutrophils Absolute: 3.2 10*3/uL (ref 1.4–7.0)
Neutrophils: 65 %
Platelets: 322 10*3/uL (ref 150–450)
RBC: 4.34 x10E6/uL (ref 3.77–5.28)
RDW: 12.4 % (ref 11.7–15.4)
WBC: 4.8 10*3/uL (ref 3.4–10.8)

## 2020-10-28 LAB — TSH: TSH: 0.949 u[IU]/mL (ref 0.450–4.500)

## 2020-10-31 ENCOUNTER — Telehealth: Payer: Self-pay

## 2020-10-31 DIAGNOSIS — E785 Hyperlipidemia, unspecified: Secondary | ICD-10-CM

## 2020-10-31 MED ORDER — SIMVASTATIN 20 MG PO TABS: 20.0000 mg | ORAL_TABLET | Freq: Every day | ORAL | 3 refills | Status: DC

## 2020-10-31 NOTE — Telephone Encounter (Signed)
-----   Message from Margo Common, PA-C sent at 10/30/2020  1:41 PM EDT ----- Blood tests essentially normal. Elevated alkaline phosphatase may be due to arthritic conditions. Other liver tests in good shape. Blood sugar slightly up - probably needs to drink more water in diet. No sign of infections or anemia. Total cholesterol and LDL cholesterol are still elevated. Triglycerides better than last year. Thyroid tests are normal. Continue the Armour Thyroid 30 mg qd. Should consider Simvastatin 20 mg qd #90 & 3 RF and recheck cholesterol in 3 months.

## 2020-11-15 ENCOUNTER — Other Ambulatory Visit: Payer: Self-pay | Admitting: Family Medicine

## 2020-11-15 DIAGNOSIS — I10 Essential (primary) hypertension: Secondary | ICD-10-CM

## 2020-11-30 ENCOUNTER — Encounter: Payer: Self-pay | Admitting: Family Medicine

## 2020-12-01 ENCOUNTER — Other Ambulatory Visit: Payer: Self-pay | Admitting: *Deleted

## 2020-12-01 DIAGNOSIS — E039 Hypothyroidism, unspecified: Secondary | ICD-10-CM

## 2020-12-01 DIAGNOSIS — I10 Essential (primary) hypertension: Secondary | ICD-10-CM

## 2020-12-01 MED ORDER — HYDROCHLOROTHIAZIDE 12.5 MG PO TABS: 12.5000 mg | ORAL_TABLET | Freq: Every day | ORAL | 3 refills | Status: DC

## 2020-12-01 MED ORDER — THYROID 30 MG PO TABS: ORAL_TABLET | ORAL | 3 refills | Status: DC

## 2020-12-01 MED ORDER — METOPROLOL SUCCINATE ER 25 MG PO TB24: 25.0000 mg | ORAL_TABLET | Freq: Every day | ORAL | 3 refills | Status: DC

## 2020-12-16 ENCOUNTER — Other Ambulatory Visit: Payer: Self-pay | Admitting: Family Medicine

## 2020-12-16 DIAGNOSIS — I10 Essential (primary) hypertension: Secondary | ICD-10-CM

## 2020-12-16 NOTE — Telephone Encounter (Signed)
Change of pharmacy Requested Prescriptions  Pending Prescriptions Disp Refills  . hydrochlorothiazide (HYDRODIURIL) 12.5 MG tablet [Pharmacy Med Name: HYDROCHLOROTHIAZIDE 12.5 MG TB] 30 tablet 2    Sig: TAKE 1 TABLET BY MOUTH EVERY DAY     Cardiovascular: Diuretics - Thiazide Passed - 12/16/2020 10:30 AM      Passed - Ca in normal range and within 360 days    Calcium  Date Value Ref Range Status  10/27/2020 9.8 8.7 - 10.2 mg/dL Final  04/01/2018 9.8  Final         Passed - Cr in normal range and within 360 days    Creatinine, Ser  Date Value Ref Range Status  10/27/2020 0.73 0.57 - 1.00 mg/dL Final         Passed - K in normal range and within 360 days    Potassium  Date Value Ref Range Status  10/27/2020 4.1 3.5 - 5.2 mmol/L Final         Passed - Na in normal range and within 360 days    Sodium  Date Value Ref Range Status  10/27/2020 141 134 - 144 mmol/L Final         Passed - Last BP in normal range    BP Readings from Last 1 Encounters:  10/27/20 125/75         Passed - Valid encounter within last 6 months    Recent Outpatient Visits          1 month ago Essential hypertension   [place], Vickki Muff, PA-C   1 year ago Essential hypertension   [facility], Vickki Muff, PA-C   2 years ago Encounter for annual physical examination excluding gynecological examination in a patient older than 33 years   [facility], Vickki Muff, PA-C   3 years ago Hypothyroidism, unspecified type   [facility], Vickki Muff, PA-C   4 years ago Need for influenza vaccination   [place], PA-C      Future Appointments            In 2 months [place], Vickki Muff, PA-C [employer], [place]

## 2020-12-18 ENCOUNTER — Other Ambulatory Visit: Payer: Self-pay | Admitting: Family Medicine

## 2020-12-18 DIAGNOSIS — E039 Hypothyroidism, unspecified: Secondary | ICD-10-CM

## 2020-12-25 ENCOUNTER — Encounter: Payer: Self-pay | Admitting: Family Medicine

## 2021-01-01 ENCOUNTER — Other Ambulatory Visit: Payer: Self-pay

## 2021-01-01 DIAGNOSIS — E039 Hypothyroidism, unspecified: Secondary | ICD-10-CM

## 2021-01-01 MED ORDER — LEVOTHYROXINE SODIUM 50 MCG PO TABS: 50.0000 ug | ORAL_TABLET | Freq: Every day | ORAL | 0 refills | Status: DC

## 2021-02-15 ENCOUNTER — Ambulatory Visit: Payer: Self-pay | Admitting: Family Medicine

## 2021-02-16 IMAGING — MG DIGITAL SCREENING BILATERAL MAMMOGRAM WITH TOMO AND CAD
8 series · 9 of 24 positions shown · non-contrast
Comparison: Previous exam(s).

CLINICAL DATA: Screening.

EXAM:
DIGITAL SCREENING BILATERAL MAMMOGRAM WITH TOMO AND CAD

[L MLO synth-2D]
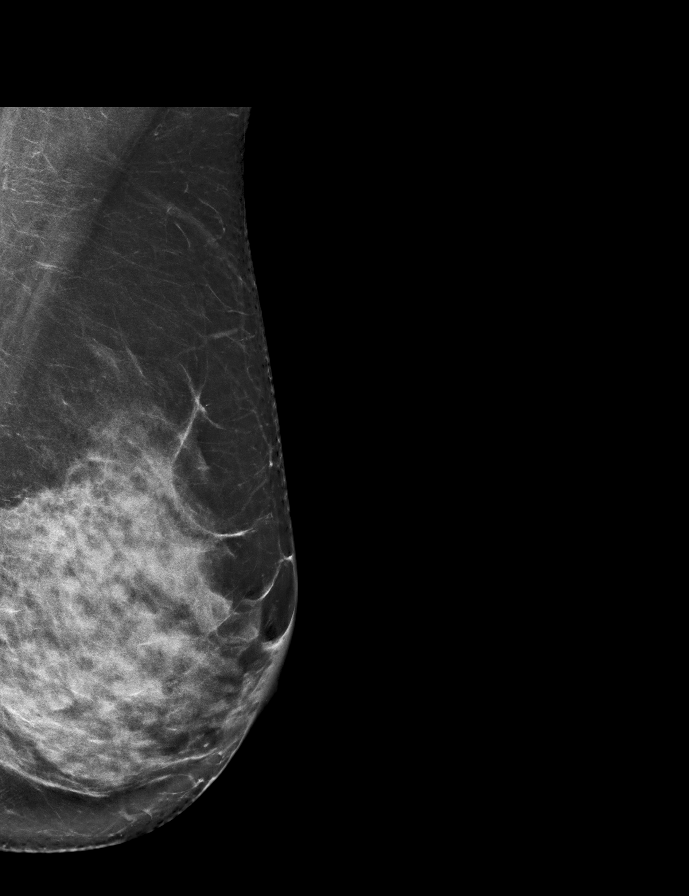

[L CC synth-2D]
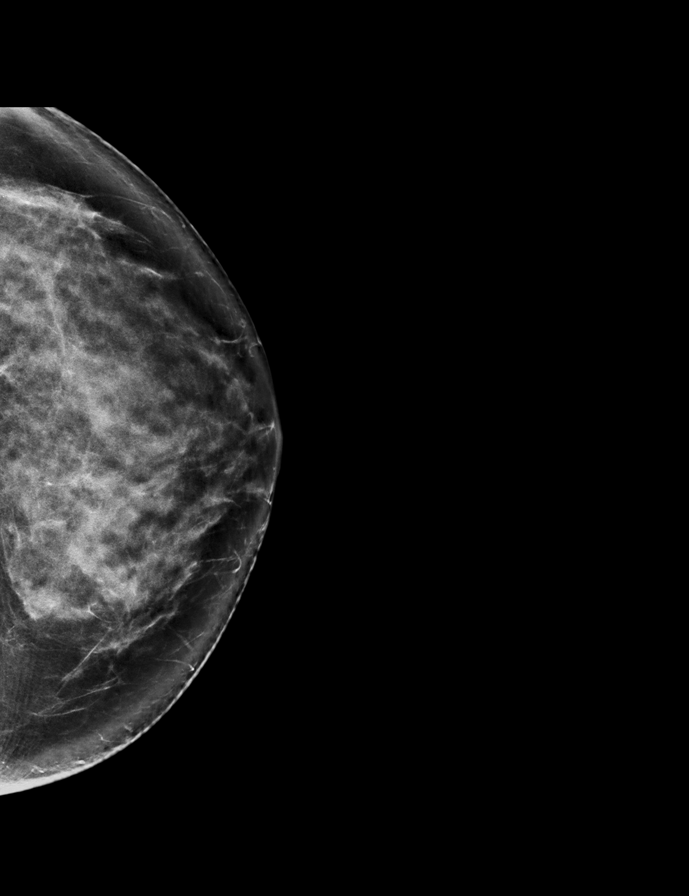

[R MLO synth-2D]
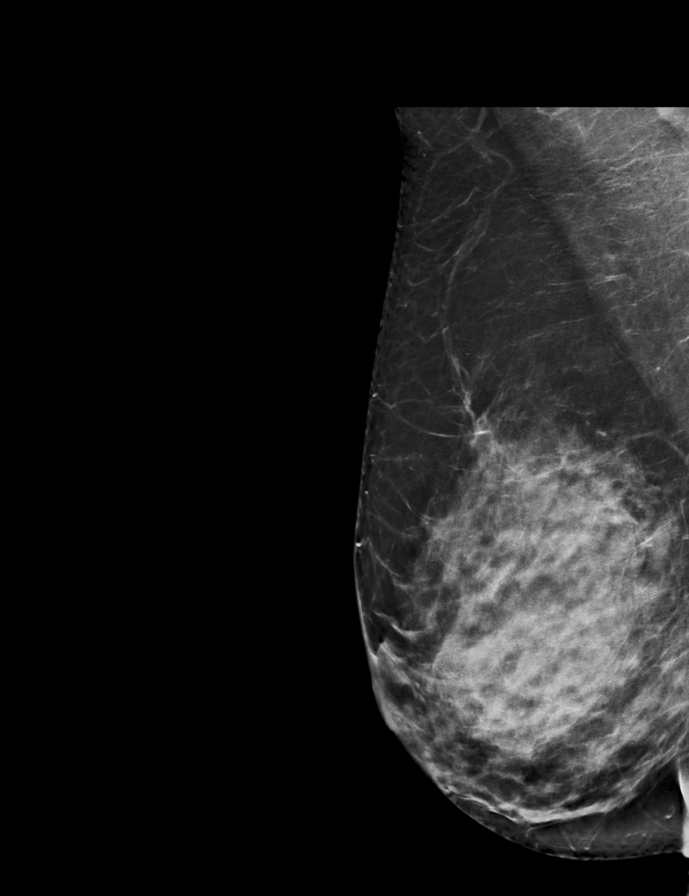

[R CC synth-2D]
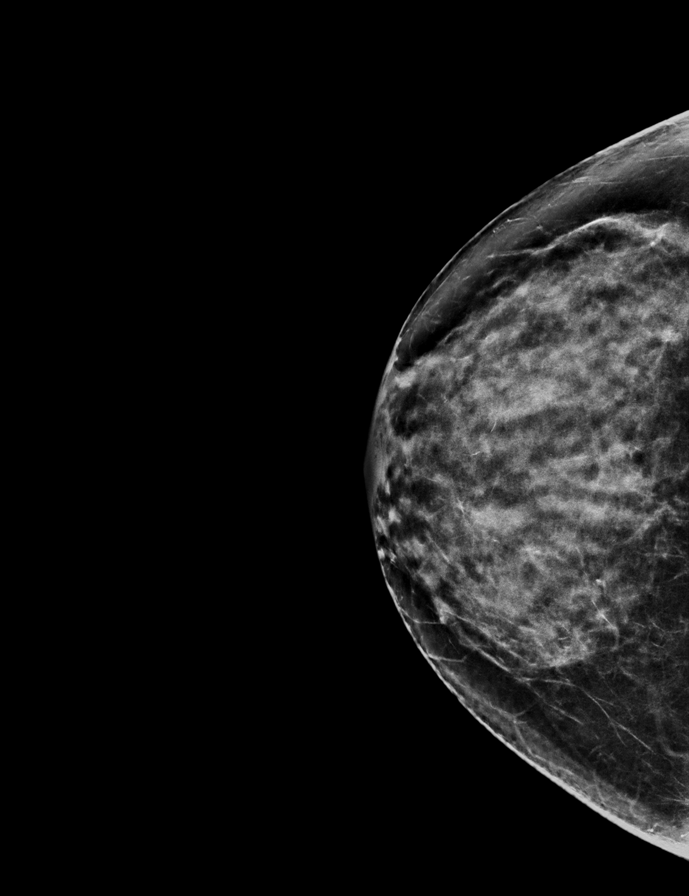

[L MLO tomo · 2 of 82 frames shown]
[frame 27/82]
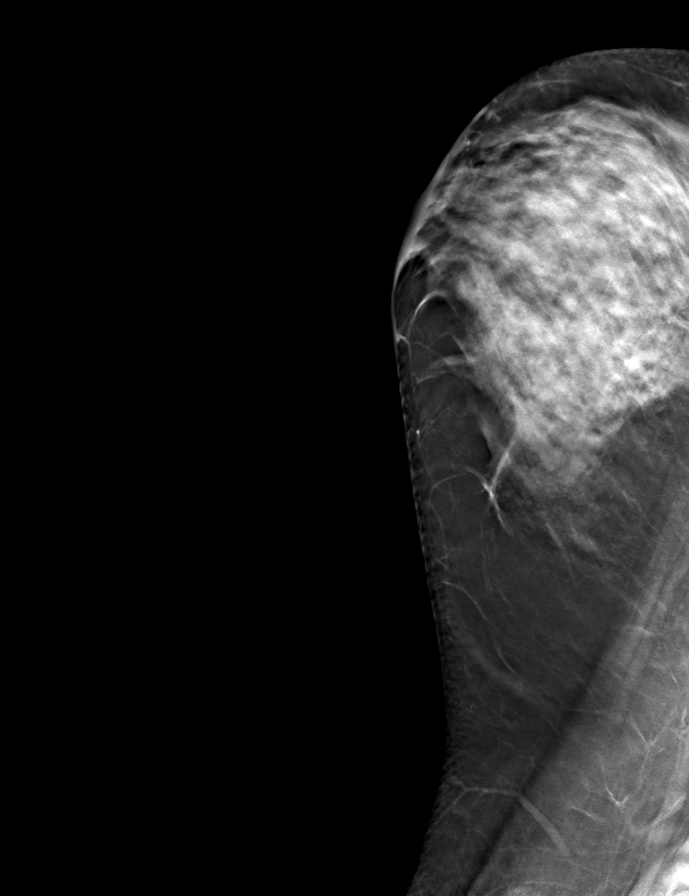
[frame 41/82]
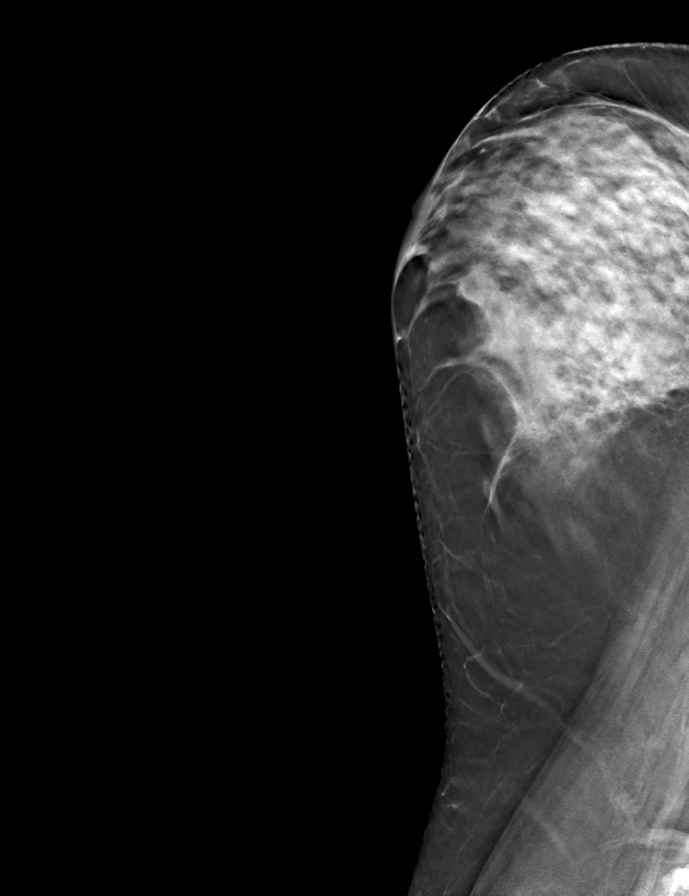

[R CC tomo · tomo slice 35/68.0]
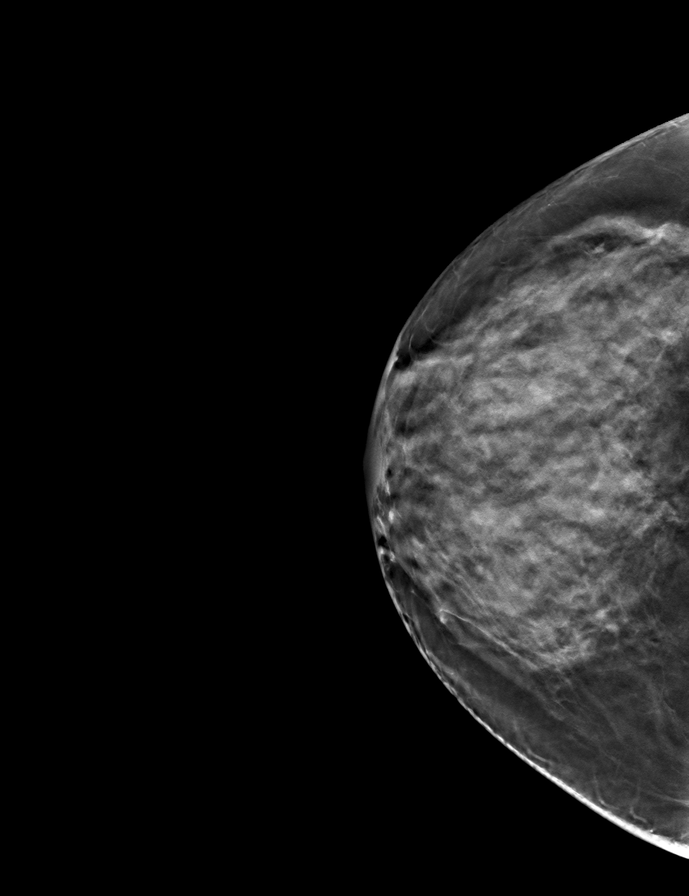

[L CC tomo · tomo slice 41/82.0]
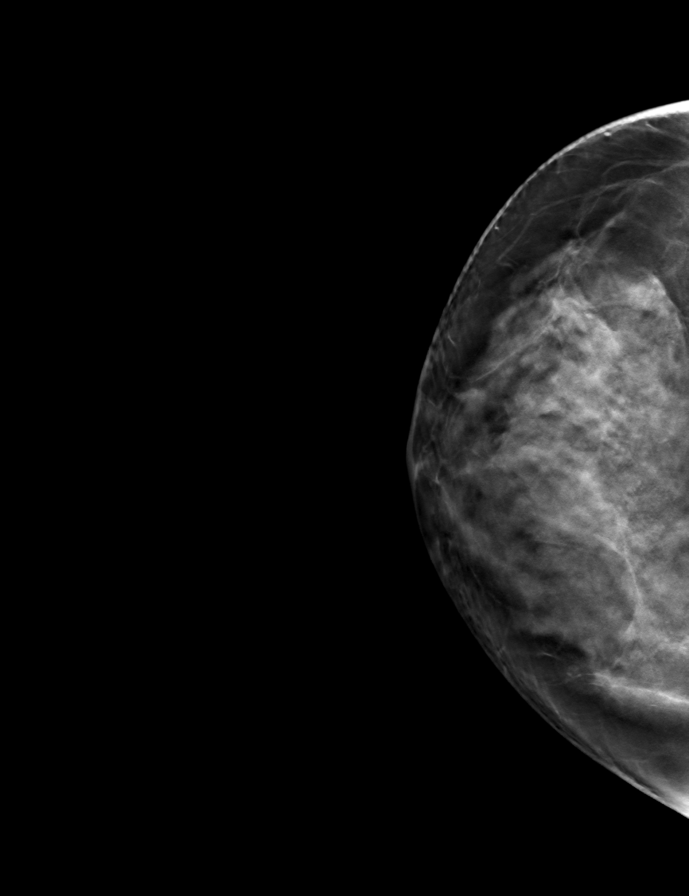

[R MLO tomo · tomo slice 41/82.0]
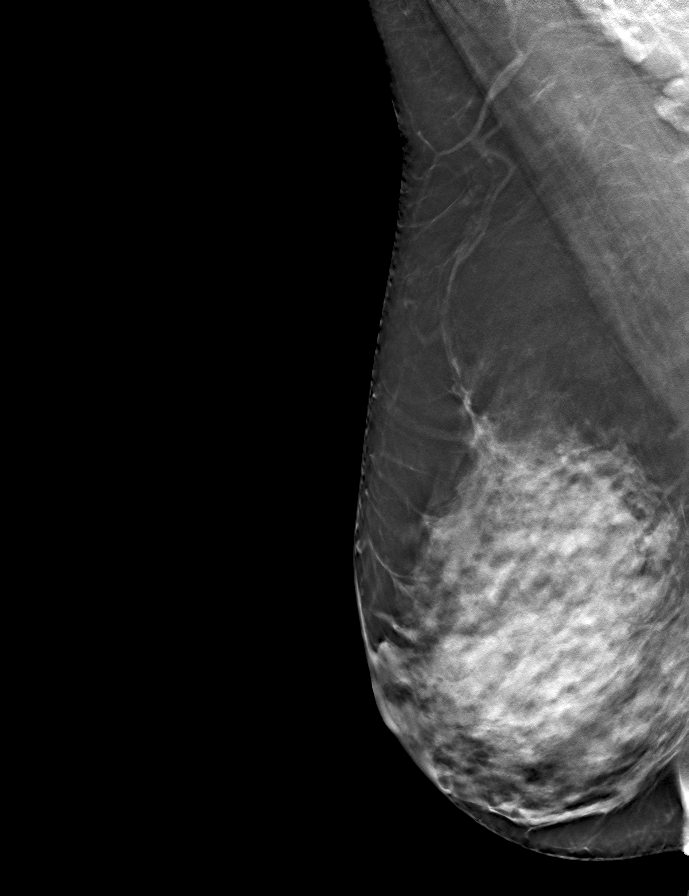

[9 of 24 positions shown; findings below may reference images not displayed]

ACR Breast Density Category c: The breast tissue is heterogeneously
dense, which may obscure small masses.
FINDINGS: There are no findings suspicious for malignancy. Images were
processed with CAD.
IMPRESSION: No mammographic evidence of malignancy. A result letter of this
screening mammogram will be mailed directly to the patient.

RECOMMENDATION:
Screening mammogram in one year. (Code:[id number])

BI-RADS CATEGORY  1: Negative.

## 2021-03-13 ENCOUNTER — Other Ambulatory Visit: Payer: Self-pay | Admitting: Family Medicine

## 2021-03-13 DIAGNOSIS — I10 Essential (primary) hypertension: Secondary | ICD-10-CM

## 2021-03-13 MED ORDER — HYDROCHLOROTHIAZIDE 12.5 MG PO TABS: 12.5000 mg | ORAL_TABLET | Freq: Every day | ORAL | 0 refills | Status: DC

## 2021-03-13 MED ORDER — LEVOTHYROXINE SODIUM 50 MCG PO TABS: 50.0000 ug | ORAL_TABLET | Freq: Every day | ORAL | 0 refills | Status: DC

## 2021-03-13 NOTE — Addendum Note (Signed)
Addended by: Corky Sox E on: 03/13/2021 11:22 AM   Modules accepted: Orders

## 2021-03-13 NOTE — Telephone Encounter (Signed)
Attempted to refill and send electronically, but continues to print instead. Routing to office to try to send electronically.

## 2021-03-13 NOTE — Telephone Encounter (Signed)
Pt would like you to re send this rx that [name] wrote levothyroxine (SYNTHROID) 50 MCG tablet to  [place], [place] ([facility]) Medication Refill - Medication: hydrochlorothiazide (HYDRODIURIL) 12.5 MG tablet  Has the patient contacted their pharmacy? Yes.   (referred Pharmacy (with phone number or street name):  Has the patient [place], [place] seen for an appointment in the last year OR does the patient have an upcoming appointment? Yes.    Can you fill this 90 days until she comes in Jan? She thinks she [facility], but she is at work and cannot check.

## 2021-03-13 NOTE — Telephone Encounter (Signed)
Requested Prescriptions  Pending Prescriptions Disp Refills  . levothyroxine (SYNTHROID) 50 MCG tablet 90 tablet 0    Sig: Take 1 tablet (50 mcg total) by mouth daily.     Endocrinology:  Hypothyroid Agents Failed - 03/13/2021  9:03 AM      Failed - TSH needs to be rechecked within 3 months after an abnormal result. Refill until TSH is due.      Passed - TSH in normal range and within 360 days    TSH  Date Value Ref Range Status  10/27/2020 0.949 0.450 - 4.500 uIU/mL Final         Passed - Valid encounter within last 12 months    Recent Outpatient Visits          4 months ago Essential hypertension   [place], Vickki Muff, PA-C   1 year ago Essential hypertension   [facility], Vickki Muff, PA-C   2 years ago Encounter for annual physical examination excluding gynecological examination in a patient older than 17 years   [facility], Vickki Muff, PA-C   3 years ago Hypothyroidism, unspecified type   [facility], Vickki Muff, PA-C   4 years ago Need for influenza vaccination   [place], PA-C      Future Appointments            In 2 months Gwyneth Sprout, [place], [facility]           . hydrochlorothiazide (HYDRODIURIL) 12.5 MG tablet 90 tablet 0    Sig: Take 1 tablet (12.5 mg total) by mouth daily.     Cardiovascular: Diuretics - Thiazide Passed - 03/13/2021  9:03 AM      Passed - Ca in normal range and within 360 days    Calcium  Date Value Ref Range Status  10/27/2020 9.8 8.7 - 10.2 mg/dL Final  04/01/2018 9.8  Final         Passed - Cr in normal range and within 360 days    Creatinine, Ser  Date Value Ref Range Status  10/27/2020 0.73 0.57 - 1.00 mg/dL Final         Passed - K in normal range and within 360 days    Potassium  Date Value Ref Range Status  10/27/2020 4.1 3.5 - 5.2 mmol/L Final         Passed - Na in  normal range and within 360 days    Sodium  Date Value Ref Range Status  10/27/2020 141 134 - 144 mmol/L Final         Passed - Last BP in normal range    BP Readings from Last 1 Encounters:  10/27/20 125/75         Passed - Valid encounter within last 6 months    Recent Outpatient Visits          4 months ago Essential hypertension   [place], Vickki Muff, PA-C   1 year ago Essential hypertension   [facility], Vickki Muff, PA-C   2 years ago Encounter for annual physical examination excluding gynecological examination in a patient older than 17 years   [facility], Vickki Muff, PA-C   3 years ago Hypothyroidism, unspecified type   [facility], Vickki Muff, PA-C   4 years ago Need for influenza vaccination   [facility], Vickki Muff,  PA-C      Future Appointments            In 2 months Rollene Rotunda, Jaci Standard, [place], [facility]

## 2021-03-13 NOTE — Telephone Encounter (Signed)
Please read message documentation

## 2021-03-22 ENCOUNTER — Telehealth: Payer: Self-pay | Admitting: Family Medicine

## 2021-03-22 NOTE — Telephone Encounter (Signed)
Pt is no longer taking Armour Thyroid she changed to levothyroxine 74mcg.  This was sent to mail order 03/13/2021   Thanks,   -Mickel Baas

## 2021-03-22 NOTE — Telephone Encounter (Signed)
[facility] for the following medications:  ARMOUR THYROID 30 MG TABLET   Please advise.

## 2021-03-24 ENCOUNTER — Encounter: Payer: Self-pay | Admitting: Family Medicine

## 2021-03-26 ENCOUNTER — Telehealth: Payer: Self-pay | Admitting: Family Medicine

## 2021-03-26 ENCOUNTER — Other Ambulatory Visit: Payer: Self-pay

## 2021-03-26 ENCOUNTER — Ambulatory Visit (INDEPENDENT_AMBULATORY_CARE_PROVIDER_SITE_OTHER): Payer: No Typology Code available for payment source | Admitting: Obstetrics and Gynecology

## 2021-03-26 ENCOUNTER — Encounter: Payer: Self-pay | Admitting: Obstetrics and Gynecology

## 2021-03-26 DIAGNOSIS — I10 Essential (primary) hypertension: Secondary | ICD-10-CM

## 2021-03-26 MED ORDER — HYDROCHLOROTHIAZIDE 12.5 MG PO TABS: 12.5000 mg | ORAL_TABLET | Freq: Every day | ORAL | 0 refills | Status: DC

## 2021-03-26 MED ORDER — METOPROLOL SUCCINATE ER 25 MG PO TB24: 25.0000 mg | ORAL_TABLET | Freq: Every day | ORAL | 0 refills | Status: DC

## 2021-03-26 MED ORDER — LEVOTHYROXINE SODIUM 50 MCG PO TABS: 50.0000 ug | ORAL_TABLET | Freq: Every day | ORAL | 0 refills | Status: DC

## 2021-03-26 NOTE — Telephone Encounter (Signed)
Spoke with [facility]-Patient's medications no longer covered by them. I was provided information for the patient to contact the new mail order pharmacy, [facility]. The patient currently has refills on file with [facility]. The patient is requesting 2 week supply of Levothyroxine, Hydrochlorothiazide  and Metoprolol to the local pharmacy while she gets prescriptions transferred to the new mail order pharmacy. Approved for a 2 week supply. Future OV 06/05/21.

## 2021-03-26 NOTE — Telephone Encounter (Signed)
Pts insurance no longer covers previous pharmacy / Pt has been wth out meds for four days /pt needs  metoprolol succinate (TOPROL-XL) 25 MG 24 hr tablet   hydrochlorothiazide (HYDRODIURIL) 12.5 MG tablet  levothyroxine (SYNTHROID) 50 MCG tablet [facility]ap [facility]  [id number] [place] [place] - [address] Phone:  [phone]  Fax:  [phone]

## 2021-03-27 NOTE — Telephone Encounter (Signed)
Please review. KW 

## 2021-03-27 NOTE — Telephone Encounter (Signed)
2 week supply on medication was sent in 03/26/21. KW

## 2021-03-29 ENCOUNTER — Other Ambulatory Visit: Payer: Self-pay | Admitting: Family Medicine

## 2021-03-29 ENCOUNTER — Encounter: Payer: Self-pay | Admitting: Family Medicine

## 2021-04-18 ENCOUNTER — Telehealth: Payer: Self-pay | Admitting: Family Medicine

## 2021-04-18 DIAGNOSIS — I10 Essential (primary) hypertension: Secondary | ICD-10-CM

## 2021-04-18 MED ORDER — METOPROLOL SUCCINATE ER 25 MG PO TB24: 25.0000 mg | ORAL_TABLET | Freq: Every day | ORAL | 0 refills | Status: DC

## 2021-04-18 NOTE — Telephone Encounter (Signed)
[facility] request for the following medications:   metoprolol succinate (TOPROL-XL) 25 MG 24 hr tablet    Please advise.  

## 2021-06-04 NOTE — Progress Notes (Deleted)
Established patient visit   Patient: Tami Finley   DOB: 1965-07-20   56 y.o. Female  MRN: [id number] Visit Date: 06/05/2021  Today's healthcare provider: Gwyneth Sprout, FNP   No chief complaint on file.  Subjective    HPI  Hypertension, follow-up  BP Readings from Last 3 Encounters:  03/26/21 122/70  10/27/20 125/75  03/24/20 110/70   Wt Readings from Last 3 Encounters:  03/26/21 162 lb 12.8 oz (73.8 kg)  10/27/20 170 lb (77.1 kg)  03/24/20 165 lb 9.6 oz (75.1 kg)     She was last seen for hypertension 6 months ago.  BP at that visit was 125/75. Management since that visit includes BP well controlled with HCTZ 12.5 mg qd and Metoprolol Succinate 25 mg qd..  She reports {excellent/good/fair/poor:19665} compliance with treatment. She {is/is not:9024} having side effects. {document side effects if present:1} She is following a {diet:21022986} diet. She {is/is not:9024} exercising. She {does/does not:200015} smoke.  Use of agents associated with hypertension: {bp agents assoc with hypertension:511::"none"}.   Outside blood pressures are {***enter patient reported home BP readings, or 'not being checked':1}. Symptoms: {Yes/No:20286} chest pain {Yes/No:20286} chest pressure  {Yes/No:20286} palpitations {Yes/No:20286} syncope  {Yes/No:20286} dyspnea {Yes/No:20286} orthopnea  {Yes/No:20286} paroxysmal nocturnal dyspnea {Yes/No:20286} lower extremity edema   Pertinent labs: Lab Results  Component Value Date   CHOL 237 (H) 10/27/2020   HDL 56 10/27/2020   LDLCALC 152 (H) 10/27/2020   TRIG 164 (H) 10/27/2020   CHOLHDL 4.2 10/27/2020   Lab Results  Component Value Date   NA 141 10/27/2020   K 4.1 10/27/2020   CREATININE 0.73 10/27/2020   EGFR 98 10/27/2020   GLUCOSE 117 (H) 10/27/2020   TSH 0.949 10/27/2020     The 10-year ASCVD risk score (Arnett DK, et al., 2019) is: 2.4%    ---------------------------------------------------------------------------------------------------  Lipid/Cholesterol, Follow-up  Last lipid panel Other pertinent labs  Lab Results  Component Value Date   CHOL 237 (H) 10/27/2020   HDL 56 10/27/2020   LDLCALC 152 (H) 10/27/2020   TRIG 164 (H) 10/27/2020   CHOLHDL 4.2 10/27/2020   Lab Results  Component Value Date   ALT 38 (H) 10/27/2020   AST 32 10/27/2020   PLT 322 10/27/2020   TSH 0.949 10/27/2020     She was last seen for this 6 months ago.  Management since that visit includes Encouraged to follow a low fat diet and exercise regularly..  She reports {excellent/good/fair/poor:19665} compliance with treatment. She {is/is not:9024} having side effects. {document side effects if present:1}  Symptoms: {Yes/No:20286} chest pain {Yes/No:20286} chest pressure/discomfort  {Yes/No:20286} dyspnea {Yes/No:20286} lower extremity edema  {Yes/No:20286} numbness or tingling of extremity {Yes/No:20286} orthopnea  {Yes/No:20286} palpitations {Yes/No:20286} paroxysmal nocturnal dyspnea  {Yes/No:20286} speech difficulty {Yes/No:20286} syncope   Current diet: {diet habits:16563} Current exercise: {exercise types:16438}  The 10-year ASCVD risk score (Arnett DK, et al., 2019) is: 2.4%  ---------------------------------------------------------------------------------------------------  Hypothyroid, follow-up  Lab Results  Component Value Date   TSH 0.949 10/27/2020   TSH 1.050 07/12/2019   TSH 1.16 04/01/2018   T4TOTAL 7.7 10/27/2020   T4TOTAL 7.0 07/12/2019    Wt Readings from Last 3 Encounters:  03/26/21 162 lb 12.8 oz (73.8 kg)  10/27/20 170 lb (77.1 kg)  03/24/20 165 lb 9.6 oz (75.1 kg)    She was last seen for hypothyroid 6 months ago.  Management since that visit includes Continues to take Armour Thyroid 30 mg qd.. She reports {excellent/good/fair/poor:19665} compliance  with treatment. She {is/is not:21021397} having  side effects. {document side effects if present:1}  Symptoms: {Yes/No:20286} change in energy level {Yes/No:20286} constipation  {Yes/No:20286} diarrhea {Yes/No:20286} heat / cold intolerance  {Yes/No:20286} nervousness {Yes/No:20286} palpitations  {Yes/No:20286} weight changes    -----------------------------------------------------------------------------------------   Medications: Outpatient Medications Prior to Visit  Medication Sig   fluticasone (FLONASE) 50 MCG/ACT nasal spray    hydrochlorothiazide (HYDRODIURIL) 12.5 MG tablet Take 1 tablet (12.5 mg total) by mouth daily.   levothyroxine (SYNTHROID) 50 MCG tablet Take 1 tablet (50 mcg total) by mouth daily.   metoprolol succinate (TOPROL-XL) 25 MG 24 hr tablet Take 1 tablet (25 mg total) by mouth daily.   Probiotic CAPS Take 1 capsule by mouth daily.   simvastatin (ZOCOR) 20 MG tablet Take 1 tablet (20 mg total) by mouth at bedtime.   No facility-administered medications prior to visit.    Review of Systems  {Labs   Heme   Chem   Endocrine   Serology   Results Review (optional):23779}   Objective    LMP 07/23/2003 Comment: IUD.  LMP 12 yrs ago. {Show previous vital signs (optional):23777}  Physical Exam  ***  No results found for any visits on 06/05/21.  Assessment & Plan     ***  No follow-ups on file.      {provider attestation***:1}   Gwyneth Sprout, [place] [phone] (phone) [phone] (fax)  [place]

## 2021-06-05 ENCOUNTER — Ambulatory Visit: Payer: Self-pay | Admitting: Family Medicine

## 2021-06-06 NOTE — Progress Notes (Signed)
Established Patient Office Visit  Subjective:  Patient ID: Tami Finley, female    DOB: Jul 30, 1965  Age: 56 y.o. MRN: [id number]  Today's Provider: Talitha Givens, MHS, PA-C  Introduced myself to the patient as a PA-C and provided education on APPs in clinical practice.    CC:  Chief Complaint  Patient presents with   Follow-up   Hypertension   Hypothyroidism    HPI Tami Finley presents for medication management follow up  Hypertension Patient does check blood pressures at home occasionally. BP is within typical ranges at home <135/<90  ROS for pertinent negatives   Hypothyroidism Patient has an eye exam scheduled  Reports intermittent dry eye and gradual progression of blurry vision  ROS for associated symptoms  Hyperlipidemia: Patient states she made changes to her diet and has lost approx 20lbs since Oct 2022 Reports more portion conscious, lean proteins, more vegetables and less carbs Increased water intake  Exercise: states she is trying to walk but work schedule and day length have limited this    Past Medical History:  Diagnosis Date   Allergy    Anxiety    History of dysplastic nevus 10/13/2018   DYSPLASTIC COMPOUND NEVUS WITH MILD ATYPIA, CLOSE TO MARGIN  -  right foot dorsum   Hyperlipidemia    Hypertension    Thyroid disease     Past Surgical History:  Procedure Laterality Date   CHOLECYSTECTOMY  06/04/2007    Family History  Problem Relation Age of Onset   Hypertension Mother    Hyperlipidemia Mother    Thyroid disease Mother    Hypertension Father    Hyperlipidemia Father    Diabetes Sister    Hypertension Sister    Thyroid disease Sister    Thyroid disease Maternal Grandmother    Breast cancer Other     Social History   Socioeconomic History   Marital status: Married    Spouse name: Not on file   Number of children: 2   Years of education: H/S   Highest education level: Not on file  Occupational History   Occupation: Full-Time   Tobacco Use   Smoking status: Never   Smokeless tobacco: Never  Substance and Sexual Activity   Alcohol use: Yes   Drug use: No   Sexual activity: Not on file  Other Topics Concern   Not on file  Social History Narrative   Not on file   Social Determinants of Health   Financial Resource Strain: Not on file  Food Insecurity: Not on file  Transportation Needs: Not on file  Physical Activity: Not on file  Stress: Not on file  Social Connections: Not on file  Intimate Partner Violence: Not on file    Outpatient Medications Prior to Visit  Medication Sig Dispense Refill   fluticasone (FLONASE) 50 MCG/ACT nasal spray      Probiotic CAPS Take 1 capsule by mouth daily.     hydrochlorothiazide (HYDRODIURIL) 12.5 MG tablet Take 1 tablet (12.5 mg total) by mouth daily. 14 tablet 0   levothyroxine (SYNTHROID) 50 MCG tablet Take 1 tablet (50 mcg total) by mouth daily. 14 tablet 0   metoprolol succinate (TOPROL-XL) 25 MG 24 hr tablet Take 1 tablet (25 mg total) by mouth daily. 90 tablet 0   simvastatin (ZOCOR) 20 MG tablet Take 1 tablet (20 mg total) by mouth at bedtime. (Patient not taking: Reported on 06/08/2021) 90 tablet 3   No facility-administered medications prior to visit.  Allergies  Allergen Reactions   Other     Other reaction(s): Headache, Other Other reaction(s): Headache, Other   Penicillins Rash    ROS Review of Systems  Constitutional:  Positive for fatigue. Negative for fever and unexpected weight change.  Eyes:  Positive for visual disturbance.       Reports intermittent eye dryness Reports gradual vision changes - blurry vision   Respiratory:  Negative for shortness of breath and wheezing.   Cardiovascular:  Negative for chest pain, palpitations and leg swelling.  Gastrointestinal:  Negative for constipation, diarrhea, nausea and vomiting.  Endocrine: Negative for cold intolerance and heat intolerance.  Skin:  Negative for color change and rash.   Psychiatric/Behavioral:  Negative for agitation, confusion and dysphoric mood. The patient is not nervous/anxious.      Objective:    Physical Exam  BP 130/86 (BP Location: Right Arm, Patient Position: Sitting, Cuff Size: Normal)    Pulse 73    Temp 98.6 F (37 C) (Temporal)    Resp 16    Ht $R'5\' 5"'tP$  (1.651 m)    Wt 150 lb (68 kg)    LMP 07/23/2003 Comment: IUD.  LMP 12 yrs ago.   SpO2 99%    BMI 24.96 kg/m  Wt Readings from Last 3 Encounters:  06/08/21 150 lb (68 kg)  03/26/21 162 lb 12.8 oz (73.8 kg)  10/27/20 170 lb (77.1 kg)     Health Maintenance Due  Topic Date Due   HIV Screening  Never done   Hepatitis C Screening  Never done   Zoster Vaccines- Shingrix (1 of 2) Never done    There are no preventive care reminders to display for this patient.  Lab Results  Component Value Date   TSH 0.949 10/27/2020   Lab Results  Component Value Date   WBC 4.8 10/27/2020   HGB 13.2 10/27/2020   HCT 39.1 10/27/2020   MCV 90 10/27/2020   PLT 322 10/27/2020   Lab Results  Component Value Date   NA 141 10/27/2020   K 4.1 10/27/2020   CO2 22 10/27/2020   GLUCOSE 117 (H) 10/27/2020   BUN 17 10/27/2020   CREATININE 0.73 10/27/2020   BILITOT 0.4 10/27/2020   ALKPHOS 163 (H) 10/27/2020   AST 32 10/27/2020   ALT 38 (H) 10/27/2020   PROT 7.2 10/27/2020   ALBUMIN 4.7 10/27/2020   CALCIUM 9.8 10/27/2020   EGFR 98 10/27/2020   Lab Results  Component Value Date   CHOL 237 (H) 10/27/2020   Lab Results  Component Value Date   HDL 56 10/27/2020   Lab Results  Component Value Date   LDLCALC 152 (H) 10/27/2020   Lab Results  Component Value Date   TRIG 164 (H) 10/27/2020   Lab Results  Component Value Date   CHOLHDL 4.2 10/27/2020   No results found for: HGBA1C    Assessment & Plan:    Problem List Items Addressed This Visit       Cardiovascular and Mediastinum   Hypertension - Primary    Chronic, currently managed with Metoprolol and HCTZ Not routinely  checking at home but reports she has not had readings >140/>85 States she has made dietary changes and lost approx 20 lbs since Oct 2022 Recommend continuation with current measures and incorporating exercise  Refills provided today for HTN medications Follow up in 6 months        Relevant Medications   metoprolol succinate (TOPROL-XL) 25 MG 24 hr tablet  hydrochlorothiazide (HYDRODIURIL) 12.5 MG tablet   Other Relevant Orders   Lipid Profile   CBC w/Diff/Platelet     Endocrine   Hypothyroidism    Chronic and appears well managed per most recent lab values Taking Levothyroxine for management Reports gradual vision changes and dry eyes - has upcoming apt with eye doctor to address Refills provided today  Lab work: TSH, CMP Follow up in 6 months       Relevant Medications   metoprolol succinate (TOPROL-XL) 25 MG 24 hr tablet   levothyroxine (SYNTHROID) 50 MCG tablet   Other Relevant Orders   TSH   Other Visit Diagnoses     Routine lab draw       Relevant Orders   Comprehensive Metabolic Panel (CMET)   Vitamin D (25 hydroxy)   Essential hypertension       Relevant Medications   metoprolol succinate (TOPROL-XL) 25 MG 24 hr tablet   hydrochlorothiazide (HYDRODIURIL) 12.5 MG tablet        Return in about 6 months (around 12/06/2021) for HTN, hypothyroid, cholesterol management.   I, Divon Krabill E Dawn Kiper, PA-C, have reviewed all documentation for this visit. The documentation on 06/08/21 for the exam, diagnosis, procedures, and orders are all accurate and complete.   Rilei Kravitz, Dani Gobble, PA-C MPH [facility]   Meds ordered this encounter  Medications   metoprolol succinate (TOPROL-XL) 25 MG 24 hr tablet    Sig: Take 1 tablet (25 mg total) by mouth daily.    Dispense:  90 tablet    Refill:  3    Patient only needs limited supply while prescriptions are being transferred to her new mail order [facility].   levothyroxine  (SYNTHROID) 50 MCG tablet    Sig: Take 1 tablet (50 mcg total) by mouth daily.    Dispense:  90 tablet    Refill:  0   hydrochlorothiazide (HYDRODIURIL) 12.5 MG tablet    Sig: Take 1 tablet (12.5 mg total) by mouth daily.    Dispense:  90 tablet    Refill:  3    Patient only needed this amount while having mail order pharmacy switched over to new one, [facility].    Follow-up: Return in about 6 months (around 12/06/2021) for HTN, hypothyroid, cholesterol management.    Tyrianna Lightle E Anish Vana, PA-C

## 2021-06-08 ENCOUNTER — Ambulatory Visit (INDEPENDENT_AMBULATORY_CARE_PROVIDER_SITE_OTHER): Payer: No Typology Code available for payment source | Admitting: Physician Assistant

## 2021-06-08 ENCOUNTER — Other Ambulatory Visit: Payer: Self-pay

## 2021-06-08 ENCOUNTER — Encounter: Payer: Self-pay | Admitting: Physician Assistant

## 2021-06-08 VITALS — BP 130/86 | HR 73 | Temp 98.6°F | Resp 16 | Ht 65.0 in | Wt 150.0 lb

## 2021-06-08 DIAGNOSIS — E039 Hypothyroidism, unspecified: Secondary | ICD-10-CM | POA: Diagnosis not present

## 2021-06-08 DIAGNOSIS — Z0189 Encounter for other specified special examinations: Secondary | ICD-10-CM

## 2021-06-08 DIAGNOSIS — I10 Essential (primary) hypertension: Secondary | ICD-10-CM

## 2021-06-08 MED ORDER — HYDROCHLOROTHIAZIDE 12.5 MG PO TABS: 12.5000 mg | ORAL_TABLET | Freq: Every day | ORAL | 3 refills | Status: DC

## 2021-06-08 MED ORDER — METOPROLOL SUCCINATE ER 25 MG PO TB24: 25.0000 mg | ORAL_TABLET | Freq: Every day | ORAL | 3 refills | Status: DC

## 2021-06-08 MED ORDER — LEVOTHYROXINE SODIUM 50 MCG PO TABS: 50.0000 ug | ORAL_TABLET | Freq: Every day | ORAL | 0 refills | Status: DC

## 2021-06-08 NOTE — Patient Instructions (Addendum)
°  For your eyes I recommend using a hydrating or lubricating  eye drop such as Blink tears - discuss your vision changes with your eye doctor at your next apt and ask about other options to manage your eye dryness  Please continue with your diet and try to incorporate more physical activity into your schedule  Recommendation is 150 minutes of moderate physical activity per week  Please come by the lab at your convenience to run lab work - fasting is preferred (no food or drink other than water, black coffee for minimum of 8 hours prior to labs)  It was nice to meet you and I appreciate the opportunity to be involved in your care

## 2021-06-08 NOTE — Assessment & Plan Note (Signed)
Chronic, currently managed with Metoprolol and HCTZ Not routinely checking at home but reports she has not had readings >140/>85 States she has made dietary changes and lost approx 20 lbs since Oct 2022 Recommend continuation with current measures and incorporating exercise  Refills provided today for HTN medications Follow up in 6 months

## 2021-06-08 NOTE — Assessment & Plan Note (Signed)
Chronic and appears well managed per most recent lab values Taking Levothyroxine for management Reports gradual vision changes and dry eyes - has upcoming apt with eye doctor to address Refills provided today  Lab work: TSH, CMP Follow up in 6 months

## 2021-07-11 LAB — COMPREHENSIVE METABOLIC PANEL
ALT: 18 IU/L (ref 0–32)
AST: 15 IU/L (ref 0–40)
Albumin/Globulin Ratio: 2.2 (ref 1.2–2.2)
Albumin: 4.8 g/dL (ref 3.8–4.9)
Alkaline Phosphatase: 148 IU/L — ABNORMAL HIGH (ref 44–121)
BUN/Creatinine Ratio: 18 (ref 9–23)
BUN: 14 mg/dL (ref 6–24)
Bilirubin Total: 0.3 mg/dL (ref 0.0–1.2)
CO2: 25 mmol/L (ref 20–29)
Calcium: 10.1 mg/dL (ref 8.7–10.2)
Chloride: 101 mmol/L (ref 96–106)
Creatinine, Ser: 0.78 mg/dL (ref 0.57–1.00)
Globulin, Total: 2.2 g/dL (ref 1.5–4.5)
Glucose: 111 mg/dL — ABNORMAL HIGH (ref 70–99)
Potassium: 4.5 mmol/L (ref 3.5–5.2)
Sodium: 142 mmol/L (ref 134–144)
Total Protein: 7 g/dL (ref 6.0–8.5)
eGFR: 90 mL/min/{1.73_m2} (ref >59)

## 2021-07-11 LAB — CBC WITH DIFFERENTIAL/PLATELET
Basophils Absolute: 0 10*3/uL (ref 0.0–0.2)
Basos: 1 %
EOS (ABSOLUTE): 0.1 10*3/uL (ref 0.0–0.4)
Eos: 1 %
Hematocrit: 38.3 % (ref 34.0–46.6)
Hemoglobin: 13.2 g/dL (ref 11.1–15.9)
Immature Grans (Abs): 0 10*3/uL (ref 0.0–0.1)
Immature Granulocytes: 0 %
Lymphocytes Absolute: 2.2 10*3/uL (ref 0.7–3.1)
Lymphs: 32 %
MCH: 31.2 pg (ref 26.6–33.0)
MCHC: 34.5 g/dL (ref 31.5–35.7)
MCV: 91 fL (ref 79–97)
Monocytes Absolute: 0.5 10*3/uL (ref 0.1–0.9)
Monocytes: 8 %
Neutrophils Absolute: 4 10*3/uL (ref 1.4–7.0)
Neutrophils: 58 %
Platelets: 306 10*3/uL (ref 150–450)
RBC: 4.23 x10E6/uL (ref 3.77–5.28)
RDW: 12.6 % (ref 11.7–15.4)
WBC: 6.8 10*3/uL (ref 3.4–10.8)

## 2021-07-11 LAB — VITAMIN D 25 HYDROXY (VIT D DEFICIENCY, FRACTURES): Vit D, 25-Hydroxy: 108 ng/mL — ABNORMAL HIGH (ref 30.0–100.0)

## 2021-07-11 LAB — LIPID PANEL
Chol/HDL Ratio: 2.8 ratio (ref 0.0–4.4)
Cholesterol, Total: 185 mg/dL (ref 100–199)
HDL: 65 mg/dL (ref >39)
LDL Chol Calc (NIH): 100 mg/dL — ABNORMAL HIGH (ref 0–99)
Triglycerides: 114 mg/dL (ref 0–149)
VLDL Cholesterol Cal: 20 mg/dL (ref 5–40)

## 2021-07-11 LAB — TSH: TSH: 1.04 u[IU]/mL (ref 0.450–4.500)

## 2021-07-31 ENCOUNTER — Ambulatory Visit (INDEPENDENT_AMBULATORY_CARE_PROVIDER_SITE_OTHER): Payer: No Typology Code available for payment source | Admitting: Dermatology

## 2021-07-31 ENCOUNTER — Other Ambulatory Visit: Payer: Self-pay

## 2021-07-31 DIAGNOSIS — D2272 Melanocytic nevi of left lower limb, including hip: Secondary | ICD-10-CM | POA: Diagnosis not present

## 2021-07-31 DIAGNOSIS — D229 Melanocytic nevi, unspecified: Secondary | ICD-10-CM | POA: Diagnosis not present

## 2021-07-31 DIAGNOSIS — L578 Other skin changes due to chronic exposure to nonionizing radiation: Secondary | ICD-10-CM | POA: Diagnosis not present

## 2021-07-31 DIAGNOSIS — Z1283 Encounter for screening for malignant neoplasm of skin: Secondary | ICD-10-CM | POA: Diagnosis not present

## 2021-07-31 NOTE — Progress Notes (Signed)
? ?  Follow-Up Visit ?  ?Subjective  ?Tami Finley is a 56 y.o. female who presents for the following: Annual Exam (Patient here for full body skin exam and skin cancer screening. Patient with hx of dysplastic nevi. Patient is not aware of any new or changing spots. ). ? ? ?The following portions of the chart were reviewed this encounter and updated as appropriate:  ?  ?  ? ?Review of Systems:  No other skin or systemic complaints except as noted in HPI or Assessment and Plan. ? ?Objective  ?Well appearing patient in no apparent distress; mood and affect are within normal limits. ? ?A full examination was performed including scalp, head, eyes, ears, nose, lips, neck, chest, axillae, abdomen, back, buttocks, bilateral upper extremities, bilateral lower extremities, hands, feet, fingers, toes, fingernails, and toenails. All findings within normal limits unless otherwise noted below. ? ?left posterior thigh ?0.2cm medium dark brown macule at left post thigh ?0.35cm med dark brown macule at left pretibia ?0.3cm med brown papule at left great toe ?0.4cm medium light brown papule at left 3rd toe ?0.4cm flesh papule at right nasal ala ?4 x 3 mm speckled brown macule at right upper back ? ?Photos compared, no changes ? ? ? ? ? ? ? ? ? ? ?Assessment & Plan  ?Nevus ?left posterior thigh ? ?Benign-appearing.  Stable. Observation.  Call clinic for new or changing lesions.  Recommend daily use of broad spectrum spf 30+ sunscreen to sun-exposed areas.  ? ? ?Lentigines ?- Scattered tan macules ?- Due to sun exposure ?- Benign-appearing, observe ?- Recommend daily broad spectrum sunscreen SPF 30+ to sun-exposed areas, reapply every 2 hours as needed. ?- Call for any changes ? ?Seborrheic Keratoses ?- Stuck-on, waxy, tan-brown papules and/or plaques  ?- Benign-appearing ?- Discussed benign etiology and prognosis. ?- Observe ?- Call for any changes ? ?Melanocytic Nevi ?- Tan-brown and/or pink-flesh-colored symmetric macules and  papules ?- Benign appearing on exam today ?- Observation ?- Call clinic for new or changing moles ?- Recommend daily use of broad spectrum spf 30+ sunscreen to sun-exposed areas.  ? ?Hemangiomas ?- Red papules ?- Discussed benign nature ?- Observe ?- Call for any changes ? ?Actinic Damage ?- Chronic condition, secondary to cumulative UV/sun exposure ?- diffuse scaly erythematous macules with underlying dyspigmentation ?- Recommend daily broad spectrum sunscreen SPF 30+ to sun-exposed areas, reapply every 2 hours as needed.  ?- Staying in the shade or wearing long sleeves, sun glasses (UVA+UVB protection) and wide brim hats (4-inch brim around the entire circumference of the hat) are also recommended for sun protection.  ?- Call for new or changing lesions. ? ?Skin cancer screening performed today. ? ?History of Dysplastic Nevi ?- No evidence of recurrence today ?- Recommend regular full body skin exams ?- Recommend daily broad spectrum sunscreen SPF 30+ to sun-exposed areas, reapply every 2 hours as needed.  ?- Call if any new or changing lesions are noted between office visits ? ?Return in about 1 year (around 08/01/2022) for TBSE. ? ?Graciella Belton, RMA, am acting as scribe for Brendolyn Patty, MD . ? ?Documentation: I have reviewed the above documentation for accuracy and completeness, and I agree with the above. ? ?Brendolyn Patty MD  ?

## 2021-07-31 NOTE — Patient Instructions (Signed)
Melanoma ABCDEs ? ?Melanoma is the most dangerous type of skin cancer, and is the leading cause of death from skin disease.  You are more likely to develop melanoma if you: ?Have light-colored skin, light-colored eyes, or red or blond hair ?Spend a lot of time in the sun ?Tan regularly, either outdoors or in a tanning bed ?Have had blistering sunburns, especially during childhood ?Have a close family member who has had a melanoma ?Have atypical moles or large birthmarks ? ?Early detection of melanoma is key since treatment is typically straightforward and cure rates are extremely high if we catch it early.  ? ?The first sign of melanoma is often a change in a mole or a new dark spot.  The ABCDE system is a way of remembering the signs of melanoma. ? ?A for asymmetry:  The two halves do not match. ?B for border:  The edges of the growth are irregular. ?C for color:  A mixture of colors are present instead of an even brown color. ?D for diameter:  Melanomas are usually (but not always) greater than 90m - the size of a pencil eraser. ?E for evolution:  The spot keeps changing in size, shape, and color. ? ?Please check your skin once per month between visits. You can use a small mirror in front and a large mirror behind you to keep an eye on the back side or your body.  ? ?If you see any new or changing lesions before your next follow-up, please call to schedule a visit. ? ?Please continue daily skin protection including broad spectrum sunscreen SPF 30+ to sun-exposed areas, reapplying every 2 hours as needed when you're outdoors.   ? ?If You Need Anything After Your Visit ? ?If you have any questions or concerns for your doctor, please call our main line at [phone] press option 4 to reach your doctor's medical assistant. If no one answers, please leave a voicemail as directed and we will return your call as soon as possible. Messages left after 4 pm will be answered the following business day.  ? ?You may also  send [place] message via MyChart. We typically respond to MyChart messages within 1-2 business days. ? ?For prescription refills, please ask your pharmacy to contact our office. Our fax number is [phone] ? ?If you have an urgent issue when the clinic is closed that cannot wait until the next business day, you can page your doctor at the number below.   ? ?Please note that while we do our best to be available for urgent issues outside of office hours, we are not available 24/7.  ? ?If you have an urgent issue and are unable to reach [place] you may choose to seek medical care at your doctor's office, retail clinic, urgent care center, or emergency room. ? ?If you have a medical emergency, please immediately call 911 or go to the emergency department. ? ?Pager Numbers ? ?- Dr. KNehemiah Massed [phone]? ?- Dr. MLaurence Ferrari [phone]? ?- Dr. SNicole Kindred [phone]? ?In the event of inclement weather, please call our main line at [phone] an update on the status of any delays or closures. ? ?Dermatology Medication Tips: ?Please keep the boxes that topical medications come in in order to help keep track of the instructions about where and how to use these. Pharmacies typically print the medication instructions only on the boxes and not directly on the medication tubes.  ? ?If your medication is too expensive, please contact  our office at [phone] option 4 or send [place] a message through [place].  ? ?We are unable to tell what your co-pay for medications will be in advance as this is different depending on your insurance coverage. However, we may be able to find a substitute medication at lower cost or fill out paperwork to get insurance to cover a needed medication.  ? ?If a prior authorization is required to get your medication covered by your insurance company, please allow [place] 1-2 business days to complete this process. ? ?Drug prices often vary depending on where the prescription is filled and some pharmacies may  offer cheaper prices. ? ?The website www.goodrx.com contains coupons for medications through different pharmacies. The prices here do not account for what the cost may be with help from insurance (it may be cheaper with your insurance), but the website can give you the price if you did not use any insurance.  ?- You can print the associated coupon and take it with your prescription to the pharmacy.  ?- You may also stop by our office during regular business hours and pick up a GoodRx coupon card.  ?- If you need your prescription sent electronically to a different pharmacy, notify our office through [facility] or by phone at [phone] option 4. ? ? ? ? ?Si Usted Necesita Algo Despu?s de Su Visita ? ?Tambi?n puede enviarnos un mensaje a trav?s de MyChart. Por lo general respondemos a los mensajes de MyChart en el transcurso de 1 a 2 d?as h?biles. ? ?Para renovar recetas, por favor pida a su farmacia que se ponga en contacto con nuestra oficina. Nuestro n?mero de fax es el [phone]. ? ?Si tiene un asunto urgente cuando la cl?nica est? cerrada y que no puede esperar hasta el siguiente d?a h?bil, puede llamar/localizar a su doctor(a) al n?mero que aparece a continuaci?n.  ? ?Por favor, tenga en cuenta que aunque hacemos todo lo posible para estar disponibles para asuntos urgentes fuera del horario de oficina, no estamos disponibles las 24 horas del d?a, los 7 d?as de la semana.  ? ?Si tiene un problema urgente y no puede comunicarse con nosotros, puede optar por buscar atenci?n m?dica  en el consultorio de su doctor(a), en una cl?nica privada, en un centro de atenci?n urgente o en una sala de emergencias. ? ?Si tiene Engineer, maintenance (IT) m?dica, por favor llame inmediatamente al 911 o vaya a la sala de emergencias. ? ?N?meros de b?per ? ?- Dr. Nehemiah Massed: [phone] ? ?- Dra. Moye: [phone] ? ?- Dra. Nicole Kindred: [phone] ? ?En caso de inclemencias del tiempo, por favor llame a nuestra l?nea principal al  [phone] para una actualizaci?n sobre el estado de cualquier retraso o cierre. ? ?Consejos para la medicaci?n en dermatolog?a: ?Por favor, guarde las cajas en las que vienen los medicamentos de uso t?pico para ayudarle a seguir las instrucciones sobre d?nde y c?mo usarlos. Las farmacias generalmente imprimen las instrucciones del medicamento s?lo en las cajas y no directamente en los tubos del [place].  ? ?Si su medicamento es muy caro, por favor, p?ngase en contacto con Zigmund Daniel llamando al [phone] y presione la opci?n 4 o env?enos un mensaje a trav?s de MyChart.  ? ?No podemos decirle cu?l ser? su copago por los medicamentos por adelantado ya que esto es diferente dependiendo de la cobertura de su seguro. Sin embargo, es posible que podamos encontrar un medicamento sustituto a Electrical engineer un formulario para que el seguro cubra el medicamento  que se considera necesario.  ? ?Si se requiere Ardelia Mems autorizaci?n previa para que su compa??a de seguros [place] su medicamento, por favor perm?tanos de 1 a 2 d?as h?biles para completar este proceso. ? ?Los precios de los medicamentos var?an con frecuencia dependiendo del Environmental consultant de d?nde se surte la receta y alguna farmacias pueden ofrecer precios m?s baratos. ? ?El sitio web www.goodrx.com tiene cupones para medicamentos de Airline pilot. Los precios aqu? no tienen en cuenta lo que podr?a costar con la ayuda del seguro (puede ser m?s barato con su seguro), pero el sitio web puede darle el precio si no utiliz? ning?n seguro.  ?- Puede imprimir el cup?n correspondiente y llevarlo con su receta a la farmacia.  ?- Tambi?n puede pasar por nuestra oficina durante el horario de atenci?n regular y recoger una tarjeta de cupones de GoodRx.  ?- Si necesita que su receta se env?e electr?nicamente a Chiropodist, informe a nuestra oficina a trav?s de MyChart de [place] o por tel?fono llamando al [phone] y presione la opci?n 4. ? ?

## 2021-08-30 NOTE — Progress Notes (Signed)
Appointment cancelled

## 2021-10-01 ENCOUNTER — Other Ambulatory Visit: Payer: Self-pay | Admitting: Physician Assistant

## 2021-10-01 DIAGNOSIS — E039 Hypothyroidism, unspecified: Secondary | ICD-10-CM

## 2021-10-02 NOTE — Telephone Encounter (Signed)
Requested medications are due for refill today.  yes ? ?Requested medications are on the active medications list.  yes ? ?Last refill. 06/08/2021 #90 0 refills ? ?Future visit scheduled.   yes ? ?Notes to clinic.  Rx written to expired 09/06/2021 - Rx is expired. ? ? ? ?Requested Prescriptions  ?Pending Prescriptions Disp Refills  ? levothyroxine (SYNTHROID) 50 MCG tablet [Pharmacy Med Name: LEVOTHYROXINE 50 MCG TABLET] 90 tablet 0  ?  Sig: TAKE 1 TABLET BY MOUTH EVERY DAY  ?  ? Endocrinology:  Hypothyroid Agents Passed - 10/01/2021  2:31 AM  ?  ?  Passed - TSH in normal range and within 360 days  ?  TSH  ?Date Value Ref Range Status  ?07/10/2021 1.040 0.450 - 4.500 uIU/mL Final  ?   ?  ?  Passed - Valid encounter within last 12 months  ?  Recent Outpatient Visits   ? ?      ? 3 months ago Primary hypertension  ? [employer], Dani Gobble, PA-C  ? 11 months ago Essential hypertension  ? [place], PA-C  ? 2 years ago Essential hypertension  ? [place], PA-C  ? 3 years ago Encounter for annual physical examination excluding gynecological examination in a patient older than 17 years  ? [place], PA-C  ? 4 years ago Hypothyroidism, unspecified type  ? [employer], PA-C  ? ?  ?  ?Future Appointments   ? ?        ? In 3 months Gwyneth Sprout, [place], [facility]  ? ?  ? ? ?  ?  ?  ?  ?

## 2021-10-16 ENCOUNTER — Ambulatory Visit (INDEPENDENT_AMBULATORY_CARE_PROVIDER_SITE_OTHER): Payer: No Typology Code available for payment source | Admitting: Obstetrics and Gynecology

## 2021-10-16 ENCOUNTER — Encounter: Payer: Self-pay | Admitting: Obstetrics and Gynecology

## 2021-10-16 VITALS — BP 146/89 | HR 69 | Ht 65.0 in | Wt 144.4 lb

## 2021-10-16 DIAGNOSIS — A630 Anogenital (venereal) warts: Secondary | ICD-10-CM

## 2021-10-16 DIAGNOSIS — N9089 Other specified noninflammatory disorders of vulva and perineum: Secondary | ICD-10-CM | POA: Diagnosis not present

## 2021-10-16 DIAGNOSIS — Z7689 Persons encountering health services in other specified circumstances: Secondary | ICD-10-CM

## 2021-10-16 NOTE — Progress Notes (Signed)
HPI:      [name] is a 56 y.o. [id number] who LMP was Patient's last menstrual period was 07/23/2003.  Subjective:   She presents today stating that she was diagnosed with a "growth" on her labia that she has not noticed.  That she denies at present she occasionally has some irritation with it but it does not cause any significant problems.  She is not sure what how long it has been there.  She reports no previous issues with Pap smears or vulvar warts. She is in menopause and states that she has been having some menopausal symptoms for many years.  She is not on HRT. She reports no new sexual partners and no history of HPV that she knows of.    Hx: The following portions of the patient's history were reviewed and updated as appropriate:             She  has a past medical history of Allergy, Anxiety, History of dysplastic nevus (10/13/2018), Hyperlipidemia, Hypertension, and Thyroid disease. She does not have any pertinent problems on file. She  has a past surgical history that includes Cholecystectomy (06/04/2007). Her family history includes Breast cancer in an other family member; Diabetes in her sister; Hyperlipidemia in her father and mother; Hypertension in her father, mother, and sister; Thyroid disease in her maternal grandmother, mother, and sister. She  reports that she has never smoked. She has never used smokeless tobacco. She reports that she does not currently use alcohol. She reports that she does not use drugs. She has a current medication list which includes the following prescription(s): vitamin c, fluticasone, hydrochlorothiazide, levothyroxine, metoprolol succinate, multiple vitamins-minerals, and probiotic. She is allergic to other and penicillins.       Review of Systems:  Review of Systems  Constitutional: Denied constitutional symptoms, night sweats, recent illness, fatigue, fever, insomnia and weight loss.  Eyes: Denied eye symptoms, eye pain, photophobia,  vision change and visual disturbance.  Ears/Nose/Throat/Neck: Denied ear, nose, throat or neck symptoms, hearing loss, nasal discharge, sinus congestion and sore throat.  Cardiovascular: Denied cardiovascular symptoms, arrhythmia, chest pain/pressure, edema, exercise intolerance, orthopnea and palpitations.  Respiratory: Denied pulmonary symptoms, asthma, pleuritic pain, productive sputum, cough, dyspnea and wheezing.  Gastrointestinal: Denied, gastro-esophageal reflux, melena, nausea and vomiting.  Genitourinary: See HPI for additional information.  Musculoskeletal: Denied musculoskeletal symptoms, stiffness, swelling, muscle weakness and myalgia.  Dermatologic: Denied dermatology symptoms, rash and scar.  Neurologic: Denied neurology symptoms, dizziness, headache, neck pain and syncope.  Psychiatric: Denied psychiatric symptoms, anxiety and depression.  Endocrine: Denied endocrine symptoms including hot flashes and night sweats.   Meds:   Current Outpatient Medications on File Prior to Visit  Medication Sig Dispense Refill   Ascorbic Acid (VITAMIN C) 100 MG tablet Take 100 mg by mouth daily.     fluticasone (FLONASE) 50 MCG/ACT nasal spray      hydrochlorothiazide (HYDRODIURIL) 12.5 MG tablet Take 1 tablet (12.5 mg total) by mouth daily. 90 tablet 3   levothyroxine (SYNTHROID) 50 MCG tablet TAKE 1 TABLET BY MOUTH EVERY DAY 90 tablet 0   metoprolol succinate (TOPROL-XL) 25 MG 24 hr tablet Take 1 tablet (25 mg total) by mouth daily. 90 tablet 3   Multiple Vitamins-Minerals (ZINC PO) Take by mouth.     Probiotic CAPS Take 1 capsule by mouth daily.     No current facility-administered medications on file prior to visit.      Objective:     Vitals:  10/16/21 1440  BP: (!) 146/89  Pulse: 69   Filed Weights   10/16/21 1440  Weight: 144 lb 6.4 oz (65.5 kg)              Physical examination   Pelvic:   Vulva: Normal appearance.  1-1/2 cm diameter flat vulvar condyloma located on  the right labia.  Vagina: No lesions or abnormalities noted.  Support: Normal pelvic support.  Urethra No masses tenderness or scarring.  Meatus Normal size without lesions or prolapse.  Cervix: Normal appearance.  No lesions.  Anus: Normal exam.  No lesions.  Perineum: Normal exam.  No lesions.             Assessment:    G2P2002 Patient Active Problem List   Diagnosis Date Noted   Carpal tunnel syndrome 07/08/2017   DDD (degenerative disc disease), cervical 07/08/2017   Thoracic back pain 07/08/2017   Fibromyositis 07/08/2017   Allergic rhinitis 03/01/2015   Absolute anemia 03/01/2015   Anxiety 03/01/2015   Arthritis 03/01/2015   Body mass index (BMI) of 24.0-24.9 in adult 03/01/2015   Abnormal C-reactive protein 03/01/2015   Elevated lipase 03/01/2015   Abnormal liver enzymes 03/01/2015   Fibrositis 03/01/2015   BP (high blood pressure) 03/01/2015   Adaptive colitis 03/01/2015   Ache in joint 03/01/2015   Hypoglycemia 03/01/2015   Abdominal pain, lower 03/01/2015   Headache, migraine 03/01/2015   IBS (irritable bowel syndrome) 03/01/2015   Flu vaccine need 03/01/2015   Fibromyalgia 03/01/2015   Hypertension 12/12/2014   Hypothyroidism 11/28/2014     1. Establishing care with new doctor, encounter for   2. Vulvar lesion   3. Condyloma acuminatum        Plan:            1.  Multiple options discussed with the patient regarding condyloma.  These include expectant management with no treatment, trichloroacetic acid with repeated treatments, OR excision and excision using electrocautery LEEP. Patient states that she would like to have office excision performed.  She has been made aware of the use of lidocaine in the use of electric current for coagulation purposes as well as the possibility of some sutures if necessary.  Recovery process also discussed.   Orders No orders of the defined types were placed in this encounter.   No orders of the defined types were  placed in this encounter.     F/U  No follow-ups on file. I spent 35 minutes involved in the care of this patient preparing to see the patient by obtaining and reviewing her medical history (including labs, imaging tests and prior procedures), documenting clinical information in the electronic health record (EHR), counseling and coordinating care plans, writing and sending prescriptions, ordering tests or procedures and in direct communicating with the patient and medical staff discussing pertinent items from her history and physical exam.  Finis Bud, M.D. 10/16/2021 3:30 PM

## 2021-10-16 NOTE — Progress Notes (Signed)
Patient presents today to discuss vulvar lesion found in March. Patient was previously seen by another provider where a vulvar lesion was found however patient states her insurance was not in network and she is now here. She states she was unaware of this lesion but now states she does feel occasional sensitivity from it, no pain. She states she does experience hot flashes sporadically at night, not on HRT but takes an OTC supplement. No other questions or concerns at this time.

## 2021-10-23 ENCOUNTER — Encounter: Payer: Self-pay | Admitting: Obstetrics and Gynecology

## 2021-11-14 ENCOUNTER — Encounter: Payer: No Typology Code available for payment source | Admitting: Obstetrics and Gynecology

## 2021-12-11 ENCOUNTER — Encounter: Payer: Self-pay | Admitting: Obstetrics and Gynecology

## 2021-12-11 ENCOUNTER — Other Ambulatory Visit (HOSPITAL_COMMUNITY)
Admission: RE | Admit: 2021-12-11 | Discharge: 2021-12-11 | Disposition: A | Discharge status: Home / Self Care | Payer: No Typology Code available for payment source | Source: Ambulatory Visit | Attending: Obstetrics and Gynecology | Admitting: Obstetrics and Gynecology

## 2021-12-11 ENCOUNTER — Ambulatory Visit (INDEPENDENT_AMBULATORY_CARE_PROVIDER_SITE_OTHER): Payer: No Typology Code available for payment source | Admitting: Obstetrics and Gynecology

## 2021-12-11 VITALS — BP 166/98 | HR 81 | Ht 65.0 in | Wt 146.9 lb

## 2021-12-11 DIAGNOSIS — A630 Anogenital (venereal) warts: Secondary | ICD-10-CM | POA: Diagnosis present

## 2021-12-11 MED ORDER — SILVER SULFADIAZINE 1 % EX CREA: 1.0000 | TOPICAL_CREAM | Freq: Two times a day (BID) | CUTANEOUS | 0 refills | Status: DC

## 2021-12-11 NOTE — Progress Notes (Signed)
HPI:      [name] is a 56 y.o. [id number] who LMP was Patient's last menstrual period was 07/23/2003.  Subjective:   She presents today because she has a large flat condyloma on her right labia that she would like removed.  We have discussed this in some detail and her options discussed.  She decided to have electrocautery removal in the office.    Hx: The following portions of the patient's history were reviewed and updated as appropriate:             She  has a past medical history of Allergy, Anxiety, History of dysplastic nevus (10/13/2018), Hyperlipidemia, Hypertension, and Thyroid disease. She does not have any pertinent problems on file. She  has a past surgical history that includes Cholecystectomy (06/04/2007). Her family history includes Breast cancer in an other family member; Diabetes in her sister; Hyperlipidemia in her father and mother; Hypertension in her father, mother, and sister; Thyroid disease in her maternal grandmother, mother, and sister. She  reports that she has never smoked. She has never used smokeless tobacco. She reports that she does not currently use alcohol. She reports that she does not use drugs. She has a current medication list which includes the following prescription(s): vitamin c, fluticasone, hydrochlorothiazide, levothyroxine, metoprolol succinate, multiple vitamins-minerals, probiotic, and silver sulfadiazine. She is allergic to other and penicillins.       Review of Systems:  Review of Systems  Constitutional: Denied constitutional symptoms, night sweats, recent illness, fatigue, fever, insomnia and weight loss.  Eyes: Denied eye symptoms, eye pain, photophobia, vision change and visual disturbance.  Ears/Nose/Throat/Neck: Denied ear, nose, throat or neck symptoms, hearing loss, nasal discharge, sinus congestion and sore throat.  Cardiovascular: Denied cardiovascular symptoms, arrhythmia, chest pain/pressure, edema, exercise intolerance,  orthopnea and palpitations.  Respiratory: Denied pulmonary symptoms, asthma, pleuritic pain, productive sputum, cough, dyspnea and wheezing.  Gastrointestinal: Denied, gastro-esophageal reflux, melena, nausea and vomiting.  Genitourinary: See HPI for additional information.  Musculoskeletal: Denied musculoskeletal symptoms, stiffness, swelling, muscle weakness and myalgia.  Dermatologic: Denied dermatology symptoms, rash and scar.  Neurologic: Denied neurology symptoms, dizziness, headache, neck pain and syncope.  Psychiatric: Denied psychiatric symptoms, anxiety and depression.  Endocrine: Denied endocrine symptoms including hot flashes and night sweats.   Meds:   Current Outpatient Medications on File Prior to Visit  Medication Sig Dispense Refill   Ascorbic Acid (VITAMIN C) 100 MG tablet Take 100 mg by mouth daily.     fluticasone (FLONASE) 50 MCG/ACT nasal spray      hydrochlorothiazide (HYDRODIURIL) 12.5 MG tablet Take 1 tablet (12.5 mg total) by mouth daily. 90 tablet 3   levothyroxine (SYNTHROID) 50 MCG tablet TAKE 1 TABLET BY MOUTH EVERY DAY 90 tablet 0   metoprolol succinate (TOPROL-XL) 25 MG 24 hr tablet Take 1 tablet (25 mg total) by mouth daily. 90 tablet 3   Multiple Vitamins-Minerals (ZINC PO) Take by mouth.     Probiotic CAPS Take 1 capsule by mouth daily.     No current facility-administered medications on file prior to visit.      Objective:     Vitals:   12/11/21 1517 12/11/21 1524  BP: (!) 166/98 (!) 166/98  Pulse: 74 81   Filed Weights   12/11/21 1517 12/11/21 1524  Weight: 146 lb 14.4 oz (66.6 kg) 146 lb 14.4 oz (66.6 kg)              Physical examination   Pelvic:   Vulva:  Normal appearance.  Large flat condyloma right labia  Anus: Normal exam.  No lesions.  Perineum: Normal exam.  No lesions.  Prior to the procedure patient was counseled regarding removal using electrocautery.  Possible scarring or discoloration discussed.  Possible although  unlikely return of vaginal wart.  Low risk of infection and bleeding discussed.  Patient consented to the procedure.  PROCEDURE:  The area was prepped using Betadine solution.  1% lidocaine with epinephrine was used to inject around the base of the lesion.  Using a flat wire loop the wart was superficially excised from the skin along its base.  A few small bleeders were noted and these were cauterized using the Bovie at [address] coag.  Hemostasis was then excellent.  Small amount of Premarin cream was placed on the defect. Patient was instructed in wound care and a prescription for Silvadene cream was entered.            Assessment:    [id number] Patient Active Problem List   Diagnosis Date Noted   Carpal tunnel syndrome 07/08/2017   DDD (degenerative disc disease), cervical 07/08/2017   Thoracic back pain 07/08/2017   Fibromyositis 07/08/2017   Allergic rhinitis 03/01/2015   Absolute anemia 03/01/2015   Anxiety 03/01/2015   Arthritis 03/01/2015   Body mass index (BMI) of 24.0-24.9 in adult 03/01/2015   Abnormal C-reactive protein 03/01/2015   Elevated lipase 03/01/2015   Abnormal liver enzymes 03/01/2015   Fibrositis 03/01/2015   BP (high blood pressure) 03/01/2015   Adaptive colitis 03/01/2015   Ache in joint 03/01/2015   Hypoglycemia 03/01/2015   Abdominal pain, lower 03/01/2015   Headache, migraine 03/01/2015   IBS (irritable bowel syndrome) 03/01/2015   Flu vaccine need 03/01/2015   Fibromyalgia 03/01/2015   Hypertension 12/12/2014   Hypothyroidism 11/28/2014     1. Condyloma acuminatum     Excisional procedure.   Plan:            1.  Follow-up in 2 weeks. Orders No orders of the defined types were placed in this encounter.    Meds ordered this encounter  Medications   silver sulfADIAZINE (SILVADENE) 1 % cream    Sig: Apply 1 Application topically 2 (two) times daily.    Dispense:  50 g    Refill:  0      F/U  Return in about 2 weeks (around  12/25/2021).  Finis Bud, M.D. 12/11/2021 4:04 PM

## 2021-12-11 NOTE — Progress Notes (Signed)
Patient presents for leep today. She states being slightly anxious but has no additional concerns.

## 2021-12-13 LAB — SURGICAL PATHOLOGY

## 2021-12-23 ENCOUNTER — Other Ambulatory Visit: Payer: Self-pay | Admitting: Family Medicine

## 2021-12-23 DIAGNOSIS — E039 Hypothyroidism, unspecified: Secondary | ICD-10-CM

## 2021-12-26 ENCOUNTER — Ambulatory Visit (INDEPENDENT_AMBULATORY_CARE_PROVIDER_SITE_OTHER): Payer: No Typology Code available for payment source | Admitting: Obstetrics and Gynecology

## 2021-12-26 ENCOUNTER — Encounter: Payer: Self-pay | Admitting: Obstetrics and Gynecology

## 2021-12-26 VITALS — BP 151/96 | HR 70 | Ht 65.0 in | Wt 147.2 lb

## 2021-12-26 DIAGNOSIS — A630 Anogenital (venereal) warts: Secondary | ICD-10-CM | POA: Diagnosis not present

## 2021-12-26 NOTE — Progress Notes (Signed)
Patient presents for 2 week follow-up after condyloma removal. She states pain and irritation the first week, used silvadene and Vaseline which helped. She is no longer having pain and has no concerns.

## 2021-12-26 NOTE — Progress Notes (Signed)
HPI:      [name] is a 56 y.o. [id number] who LMP was Patient's last menstrual period was 07/23/2003.  Subjective:   She presents today 2 weeks after removal of vulvar condyloma.  She reports she has been using Silvadene cream and Vaseline on it and is healing up well.  She reports she is not having any pain at this time.    Hx: The following portions of the patient's history were reviewed and updated as appropriate:             She  has a past medical history of Allergy, Anxiety, History of dysplastic nevus (10/13/2018), Hyperlipidemia, Hypertension, and Thyroid disease. She does not have any pertinent problems on file. She  has a past surgical history that includes Cholecystectomy (06/04/2007). Her family history includes Breast cancer in an other family member; Diabetes in her sister; Hyperlipidemia in her father and mother; Hypertension in her father, mother, and sister; Thyroid disease in her maternal grandmother, mother, and sister. She  reports that she has never smoked. She has never used smokeless tobacco. She reports that she does not currently use alcohol. She reports that she does not use drugs. She has a current medication list which includes the following prescription(s): vitamin c, fluticasone, hydrochlorothiazide, levothyroxine, metoprolol succinate, and multiple vitamins-minerals. She is allergic to other and penicillins.       Review of Systems:  Review of Systems  Constitutional: Denied constitutional symptoms, night sweats, recent illness, fatigue, fever, insomnia and weight loss.  Eyes: Denied eye symptoms, eye pain, photophobia, vision change and visual disturbance.  Ears/Nose/Throat/Neck: Denied ear, nose, throat or neck symptoms, hearing loss, nasal discharge, sinus congestion and sore throat.  Cardiovascular: Denied cardiovascular symptoms, arrhythmia, chest pain/pressure, edema, exercise intolerance, orthopnea and palpitations.  Respiratory: Denied pulmonary  symptoms, asthma, pleuritic pain, productive sputum, cough, dyspnea and wheezing.  Gastrointestinal: Denied, gastro-esophageal reflux, melena, nausea and vomiting.  Genitourinary: Denied genitourinary symptoms including symptomatic vaginal discharge, pelvic relaxation issues, and urinary complaints.  Musculoskeletal: Denied musculoskeletal symptoms, stiffness, swelling, muscle weakness and myalgia.  Dermatologic: Denied dermatology symptoms, rash and scar.  Neurologic: Denied neurology symptoms, dizziness, headache, neck pain and syncope.  Psychiatric: Denied psychiatric symptoms, anxiety and depression.  Endocrine: Denied endocrine symptoms including hot flashes and night sweats.   Meds:   Current Outpatient Medications on File Prior to Visit  Medication Sig Dispense Refill   Ascorbic Acid (VITAMIN C) 100 MG tablet Take 100 mg by mouth daily.     fluticasone (FLONASE) 50 MCG/ACT nasal spray      hydrochlorothiazide (HYDRODIURIL) 12.5 MG tablet Take 1 tablet (12.5 mg total) by mouth daily. 90 tablet 3   levothyroxine (SYNTHROID) 50 MCG tablet TAKE 1 TABLET BY MOUTH EVERY DAY 90 tablet 0   metoprolol succinate (TOPROL-XL) 25 MG 24 hr tablet Take 1 tablet (25 mg total) by mouth daily. 90 tablet 3   Multiple Vitamins-Minerals (ZINC PO) Take by mouth.     No current facility-administered medications on file prior to visit.      Objective:     Vitals:   12/26/21 1524  BP: (!) 151/96  Pulse: 70   Filed Weights   12/26/21 1524  Weight: 147 lb 3.2 oz (66.8 kg)              Right labial condyloma was removed healing well.  No erythema.          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   Allergic rhinitis 03/01/2015   Absolute anemia 03/01/2015   Anxiety 03/01/2015   Arthritis 03/01/2015    Body mass index (BMI) of 24.0-24.9 in adult 03/01/2015   Abnormal C-reactive protein 03/01/2015   Elevated lipase 03/01/2015   Abnormal liver enzymes 03/01/2015   Fibrositis 03/01/2015   BP (high blood pressure) 03/01/2015   Adaptive colitis 03/01/2015   Ache in joint 03/01/2015   Hypoglycemia 03/01/2015   Abdominal pain, lower 03/01/2015   Headache, migraine 03/01/2015   IBS (irritable bowel syndrome) 03/01/2015   Flu vaccine need 03/01/2015   Fibromyalgia 03/01/2015   Hypertension 12/12/2014   Hypothyroidism 11/28/2014     1. Condyloma acuminatum     Post removal-doing well   Plan:            1.  Patient may resume normal activities.  Follow-up for annual exam. Orders No orders of the defined types were placed in this encounter.   No orders of the defined types were placed in this encounter.     F/U  Return for Annual Physical.  Finis Bud, M.D. 12/26/2021 4:05 PM

## 2022-01-03 NOTE — Progress Notes (Deleted)
Established patient visit   Patient: Tami Finley   DOB: May 07, 1966   56 y.o. Female  MRN: [id number] Visit Date: 01/08/2022  Today's healthcare provider: Gwyneth Sprout, FNP   No chief complaint on file.  Subjective    HPI  Hypothyroid, follow-up  Lab Results  Component Value Date   TSH 1.040 07/10/2021   TSH 0.949 10/27/2020   TSH 1.050 07/12/2019   T4TOTAL 7.7 10/27/2020   T4TOTAL 7.0 07/12/2019    Wt Readings from Last 3 Encounters:  12/26/21 147 lb 3.2 oz (66.8 kg)  12/11/21 146 lb 14.4 oz (66.6 kg)  10/16/21 144 lb 6.4 oz (65.5 kg)    She was last seen for hypothyroid 6 months ago.  Management since that visit includes continue medication. She reports {excellent/good/fair/poor:19665} compliance with treatment. She {is/is not:21021397} having side effects. {document side effects if present:1}  Symptoms: {Yes/No:20286} change in energy level {Yes/No:20286} constipation  {Yes/No:20286} diarrhea {Yes/No:20286} heat / cold intolerance  {Yes/No:20286} nervousness {Yes/No:20286} palpitations  {Yes/No:20286} weight changes    Hypertension, follow-up  BP Readings from Last 3 Encounters:  12/26/21 (!) 151/96  12/11/21 (!) 166/98  10/16/21 (!) 146/89   Wt Readings from Last 3 Encounters:  12/26/21 147 lb 3.2 oz (66.8 kg)  12/11/21 146 lb 14.4 oz (66.6 kg)  10/16/21 144 lb 6.4 oz (65.5 kg)     She was last seen for hypertension 6 months ago.  BP at that visit was 130/86. Management since that visit includes continue medications.  She reports {excellent/good/fair/poor:19665} compliance with treatment. She {is/is not:9024} having side effects. {document side effects if present:1} She is following a {diet:21022986} diet. She {is/is not:9024} exercising. She {does/does not:200015} smoke.  Use of agents associated with hypertension: {bp agents assoc with hypertension:511::"none"}.   Outside blood pressures are {***enter patient reported home BP readings, or  'not being checked':1}. Symptoms: {Yes/No:20286} chest pain {Yes/No:20286} chest pressure  {Yes/No:20286} palpitations {Yes/No:20286} syncope  {Yes/No:20286} dyspnea {Yes/No:20286} orthopnea  {Yes/No:20286} paroxysmal nocturnal dyspnea {Yes/No:20286} lower extremity edema   Pertinent labs Lab Results  Component Value Date   CHOL 185 07/10/2021   HDL 65 07/10/2021   LDLCALC 100 (H) 07/10/2021   TRIG 114 07/10/2021   CHOLHDL 2.8 07/10/2021   Lab Results  Component Value Date   NA 142 07/10/2021   K 4.5 07/10/2021   CREATININE 0.78 07/10/2021   EGFR 90 07/10/2021   GLUCOSE 111 (H) 07/10/2021   TSH 1.040 07/10/2021     The 10-year ASCVD risk score (Arnett DK, et al., 2019) is: 2.6%  ---------------------------------------------------------------------------------------------------  -----------------------------------------------------------------------------------------   Medications: Outpatient Medications Prior to Visit  Medication Sig   Ascorbic Acid (VITAMIN C) 100 MG tablet Take 100 mg by mouth daily.   fluticasone (FLONASE) 50 MCG/ACT nasal spray    hydrochlorothiazide (HYDRODIURIL) 12.5 MG tablet Take 1 tablet (12.5 mg total) by mouth daily.   levothyroxine (SYNTHROID) 50 MCG tablet TAKE 1 TABLET BY MOUTH EVERY DAY   metoprolol succinate (TOPROL-XL) 25 MG 24 hr tablet Take 1 tablet (25 mg total) by mouth daily.   Multiple Vitamins-Minerals (ZINC PO) Take by mouth.   No facility-administered medications prior to visit.    Review of Systems  {Labs  Heme  Chem  Endocrine  Serology  Results Review (optional):23779}   Objective    LMP 07/23/2003 Comment: IUD.  LMP 12 yrs ago. {Show previous vital signs (optional):23777}  Physical Exam  ***  No results found for any visits on 01/08/22.  Assessment & Plan     ***  No follow-ups on file.      {provider attestation***:1}   Gwyneth Sprout, [place] [phone]  (phone) [phone] (fax)  [place]

## 2022-01-07 NOTE — Progress Notes (Unsigned)
Established patient visit  I,Joseline E Rosas,acting as a scribe for Gwyneth Sprout, FNP.,have documented all relevant documentation on the behalf of Gwyneth Sprout, FNP,as directed by  Gwyneth Sprout, FNP while in the presence of Gwyneth Sprout, FNP.   Patient: Tami Finley   DOB: December 20, 1965   56 y.o. Female  MRN: [id number] Visit Date: 01/10/2022  Today's healthcare provider: Gwyneth Sprout, FNP  Patient presents for new patient visit to establish care.  Introduced to Designer, jewellery role and practice setting.  All questions answered.  Discussed provider/patient relationship and expectations.   Chief Complaint  Patient presents with   follow-up Chronic Disease   Subjective    HPI  Hypothyroid, follow-up  Lab Results  Component Value Date   TSH 1.040 07/10/2021   TSH 0.949 10/27/2020   TSH 1.050 07/12/2019   T4TOTAL 7.7 10/27/2020   T4TOTAL 7.0 07/12/2019    Wt Readings from Last 3 Encounters:  01/10/22 145 lb 11.2 oz (66.1 kg)  12/26/21 147 lb 3.2 oz (66.8 kg)  12/11/21 146 lb 14.4 oz (66.6 kg)    She was last seen for hypothyroid 7 months ago.  Management since that visit includes continue medication. She reports excellent compliance with treatment. She is not having side effects.   Symptoms: No change in energy level No constipation  No diarrhea No heat / cold intolerance  No nervousness No palpitations  No weight changes    Hypertension, follow-up  BP Readings from Last 3 Encounters:  01/10/22 (!) 149/88  12/26/21 (!) 151/96  12/11/21 (!) 166/98   Wt Readings from Last 3 Encounters:  01/10/22 145 lb 11.2 oz (66.1 kg)  12/26/21 147 lb 3.2 oz (66.8 kg)  12/11/21 146 lb 14.4 oz (66.6 kg)     She was last seen for hypertension 7 months ago.  BP at that visit was 130/86. Management since that visit includes continue medication.  She reports excellent compliance with treatment. She is not having side effects.  She is following a Regular diet. She is  not exercising. She does not smoke.  Use of agents associated with hypertension: none.   Outside blood pressures are not being checked. Symptoms: No chest pain No chest pressure  No palpitations No syncope  No dyspnea No orthopnea  No paroxysmal nocturnal dyspnea No lower extremity edema   Pertinent labs Lab Results  Component Value Date   CHOL 185 07/10/2021   HDL 65 07/10/2021   LDLCALC 100 (H) 07/10/2021   TRIG 114 07/10/2021   CHOLHDL 2.8 07/10/2021   Lab Results  Component Value Date   NA 142 07/10/2021   K 4.5 07/10/2021   CREATININE 0.78 07/10/2021   EGFR 90 07/10/2021   GLUCOSE 111 (H) 07/10/2021   TSH 1.040 07/10/2021     The 10-year ASCVD risk score (Arnett DK, et al., 2019) is: 2.6%  Lipid/Cholesterol, Follow-up  Last lipid panel Other pertinent labs  Lab Results  Component Value Date   CHOL 185 07/10/2021   HDL 65 07/10/2021   LDLCALC 100 (H) 07/10/2021   TRIG 114 07/10/2021   CHOLHDL 2.8 07/10/2021   Lab Results  Component Value Date   ALT 18 07/10/2021   AST 15 07/10/2021   PLT 306 07/10/2021   TSH 1.040 07/10/2021     She was last seen for this 7 months ago.  Management since that visit includes continue with lifestyle modifications to continue management .  Medications: Outpatient Medications  Prior to Visit  Medication Sig   Ascorbic Acid (VITAMIN C) 100 MG tablet Take 100 mg by mouth daily.   fluticasone (FLONASE) 50 MCG/ACT nasal spray    levothyroxine (SYNTHROID) 50 MCG tablet TAKE 1 TABLET BY MOUTH EVERY DAY   metoprolol succinate (TOPROL-XL) 25 MG 24 hr tablet Take 1 tablet (25 mg total) by mouth daily.   Multiple Vitamins-Minerals (ZINC PO) Take by mouth.   [DISCONTINUED] hydrochlorothiazide (HYDRODIURIL) 12.5 MG tablet Take 1 tablet (12.5 mg total) by mouth daily.   No facility-administered medications prior to visit.    Review of Systems    Objective    BP (!) 149/88 (BP Location: Left Arm, Patient Position: Sitting, Cuff  Size: Normal)   Pulse 69   Temp 98.1 F (36.7 C) (Oral)   Resp 16   Ht $R'5\' 5"'bO$  (1.651 m)   Wt 145 lb 11.2 oz (66.1 kg)   LMP 07/23/2003 Comment: IUD.  LMP 12 yrs ago.  BMI 24.25 kg/m    Physical Exam Vitals and nursing note reviewed.  Constitutional:      General: She is not in acute distress.    Appearance: Normal appearance. She is normal weight. She is not ill-appearing, toxic-appearing or diaphoretic.  HENT:     Head: Normocephalic and atraumatic.  Cardiovascular:     Rate and Rhythm: Normal rate and regular rhythm.     Pulses: Normal pulses.     Heart sounds: Normal heart sounds. No murmur heard.    No friction rub. No gallop.  Pulmonary:     Effort: Pulmonary effort is normal. No respiratory distress.     Breath sounds: Normal breath sounds. No stridor. No wheezing, rhonchi or rales.  Chest:     Chest wall: No tenderness.  Musculoskeletal:        General: No swelling, tenderness, deformity or signs of injury. Normal range of motion.     Right lower leg: No edema.     Left lower leg: No edema.  Skin:    General: Skin is warm and dry.     Capillary Refill: Capillary refill takes less than 2 seconds.     Coloration: Skin is not jaundiced or pale.     Findings: No bruising, erythema, lesion or rash.  Neurological:     General: No focal deficit present.     Mental Status: She is alert and oriented to person, place, and time. Mental status is at baseline.     Cranial Nerves: No cranial nerve deficit.     Sensory: No sensory deficit.     Motor: No weakness.     Coordination: Coordination normal.  Psychiatric:        Mood and Affect: Mood normal.        Behavior: Behavior normal.        Thought Content: Thought content normal.        Judgment: Judgment normal.    No results found for any visits on 01/10/22.  Assessment & Plan     Problem List Items Addressed This Visit       Cardiovascular and Mediastinum   Hypertension - Primary    Chronic, elevated Goal  <140/<90 Recommend titration to 25 mg of HCTZ Follow up in 2 months with 2nd shingles vaccine Check CMP and CBC      Relevant Medications   hydrochlorothiazide (HYDRODIURIL) 12.5 MG tablet   Other Relevant Orders   Comprehensive metabolic panel   Hemoglobin A1c     Endocrine  Hypothyroidism    Chronic, stable previously on 50 mcg synthroid; previously on armour- however, no longer covered under insurance Repeat TSH and free t4      Relevant Orders   TSH + free T4     Other   Blood glucose elevated    Recommend A1c given elevated serum glucose; Continue to recommend balanced, lower carb meals. Smaller meal size, adding snacks. Choosing water as drink of choice and increasing purposeful exercise.       Relevant Orders   Hemoglobin A1c   Encounter for hepatitis C screening test for low risk patient    Low risk screen Treatable, and curable. If left untreated Hep C can lead to cirrhosis and liver failure. Encourage routine testing; recommend repeat testing if risk factors change.       Relevant Orders   Hepatitis C Antibody   Encounter for screening for HIV    Low risk screen Consented; encouraged to "know your status" Recommend repeat screen if risk factors change       Relevant Orders   HIV antibody (with reflex)   Hyperlipidemia    Chronic, borderline Repeat LP I recommend diet low in saturated fat and regular exercise - 30 min at least 5 times per week Not on statin at this time      Relevant Medications   hydrochlorothiazide (HYDRODIURIL) 12.5 MG tablet   Other Relevant Orders   Lipid panel   Need for shingles vaccine    Consented. VIS made available. Repeat dose in 2 months; no immediate complications.       Relevant Orders   Varicella-zoster vaccine IM (Completed)   Return in about 2 months (around 03/12/2022) for HTN management, immunization.     Vonna Kotyk, FNP, have reviewed all documentation for this visit. The documentation on 01/10/22  for the exam, diagnosis, procedures, and orders are all accurate and complete.  Gwyneth Sprout, [place] [phone] (phone) [phone] (fax)  [place]

## 2022-01-08 ENCOUNTER — Ambulatory Visit: Payer: No Typology Code available for payment source | Admitting: Family Medicine

## 2022-01-10 ENCOUNTER — Ambulatory Visit (INDEPENDENT_AMBULATORY_CARE_PROVIDER_SITE_OTHER): Payer: No Typology Code available for payment source | Admitting: Family Medicine

## 2022-01-10 ENCOUNTER — Encounter: Payer: Self-pay | Admitting: Family Medicine

## 2022-01-10 VITALS — BP 149/88 | HR 69 | Temp 98.1°F | Resp 16 | Ht 65.0 in | Wt 145.7 lb

## 2022-01-10 DIAGNOSIS — Z1159 Encounter for screening for other viral diseases: Secondary | ICD-10-CM | POA: Insufficient documentation

## 2022-01-10 DIAGNOSIS — E785 Hyperlipidemia, unspecified: Secondary | ICD-10-CM | POA: Diagnosis not present

## 2022-01-10 DIAGNOSIS — E039 Hypothyroidism, unspecified: Secondary | ICD-10-CM | POA: Diagnosis not present

## 2022-01-10 DIAGNOSIS — R739 Hyperglycemia, unspecified: Secondary | ICD-10-CM | POA: Insufficient documentation

## 2022-01-10 DIAGNOSIS — I10 Essential (primary) hypertension: Secondary | ICD-10-CM

## 2022-01-10 DIAGNOSIS — Z23 Encounter for immunization: Secondary | ICD-10-CM | POA: Diagnosis not present

## 2022-01-10 DIAGNOSIS — Z114 Encounter for screening for human immunodeficiency virus [HIV]: Secondary | ICD-10-CM | POA: Insufficient documentation

## 2022-01-10 MED ORDER — HYDROCHLOROTHIAZIDE 12.5 MG PO TABS: 25.0000 mg | ORAL_TABLET | Freq: Every day | ORAL | 3 refills | Status: DC

## 2022-01-10 NOTE — Assessment & Plan Note (Signed)
Consented. VIS made available. Repeat dose in 2 months; no immediate complications.

## 2022-01-10 NOTE — Assessment & Plan Note (Signed)
Chronic, stable previously on 50 mcg synthroid; previously on armour- however, no longer covered under insurance Repeat TSH and free t4

## 2022-01-10 NOTE — Assessment & Plan Note (Signed)
Low risk screen ?Consented; encouraged to "know your status" ?Recommend repeat screen if risk factors change ? ?

## 2022-01-10 NOTE — Assessment & Plan Note (Signed)
Chronic, elevated Goal <140/<90 Recommend titration to 25 mg of HCTZ Follow up in 2 months with 2nd shingles vaccine Check CMP and CBC

## 2022-01-10 NOTE — Assessment & Plan Note (Signed)
Low risk screen Treatable, and curable. If left untreated Hep C can lead to cirrhosis and liver failure. Encourage routine testing; recommend repeat testing if risk factors change.  

## 2022-01-10 NOTE — Assessment & Plan Note (Signed)
Recommend A1c given elevated serum glucose; Continue to recommend balanced, lower carb meals. Smaller meal size, adding snacks. Choosing water as drink of choice and increasing purposeful exercise.

## 2022-01-10 NOTE — Assessment & Plan Note (Signed)
Chronic, borderline Repeat LP I recommend diet low in saturated fat and regular exercise - 30 min at least 5 times per week Not on statin at this time

## 2022-01-11 LAB — TSH+FREE T4
Free T4: 1.61 ng/dL (ref 0.82–1.77)
TSH: 0.996 u[IU]/mL (ref 0.450–4.500)

## 2022-01-11 LAB — COMPREHENSIVE METABOLIC PANEL
ALT: 17 IU/L (ref 0–32)
AST: 16 IU/L (ref 0–40)
Albumin/Globulin Ratio: 2 (ref 1.2–2.2)
Albumin: 4.6 g/dL (ref 3.8–4.9)
Alkaline Phosphatase: 115 IU/L (ref 44–121)
BUN/Creatinine Ratio: 29 — ABNORMAL HIGH (ref 9–23)
BUN: 20 mg/dL (ref 6–24)
Bilirubin Total: <0.2 mg/dL (ref 0.0–1.2)
CO2: 22 mmol/L (ref 20–29)
Calcium: 10.3 mg/dL — ABNORMAL HIGH (ref 8.7–10.2)
Chloride: 101 mmol/L (ref 96–106)
Creatinine, Ser: 0.7 mg/dL (ref 0.57–1.00)
Globulin, Total: 2.3 g/dL (ref 1.5–4.5)
Glucose: 93 mg/dL (ref 70–99)
Potassium: 4.6 mmol/L (ref 3.5–5.2)
Sodium: 139 mmol/L (ref 134–144)
Total Protein: 6.9 g/dL (ref 6.0–8.5)
eGFR: 103 mL/min/{1.73_m2} (ref >59)

## 2022-01-11 LAB — HEMOGLOBIN A1C
Est. average glucose Bld gHb Est-mCnc: 105 mg/dL
Hgb A1c MFr Bld: 5.3 % (ref 4.8–5.6)

## 2022-01-11 LAB — HEPATITIS C ANTIBODY: Hep C Virus Ab: NONREACTIVE

## 2022-01-11 LAB — LIPID PANEL
Chol/HDL Ratio: 2.8 ratio (ref 0.0–4.4)
Cholesterol, Total: 210 mg/dL — ABNORMAL HIGH (ref 100–199)
HDL: 74 mg/dL (ref >39)
LDL Chol Calc (NIH): 121 mg/dL — ABNORMAL HIGH (ref 0–99)
Triglycerides: 86 mg/dL (ref 0–149)
VLDL Cholesterol Cal: 15 mg/dL (ref 5–40)

## 2022-01-11 LAB — HIV ANTIBODY (ROUTINE TESTING W REFLEX): HIV Screen 4th Generation wRfx: NONREACTIVE

## 2022-01-11 NOTE — Progress Notes (Signed)
All labs normal and stable with exception of cholesterol.   Cholesterol is slight elevated with LDL at 121 and total at 210. The 10-year ASCVD risk score (Arnett DK, et al., 2019) is: 2.6%   Values used to calculate the score:     Age: 56 years     Sex: Female     Is Non-Hispanic African American: No     Diabetic: No     Tobacco smoker: No     Systolic Blood Pressure: 329 mmHg     Is BP treated: Yes     HDL Cholesterol: 74 mg/dL     Total Cholesterol: 210 mg/dL Heart attack and stroke risk is 2.6% estimated within the next 10 years which is low. I recommend diet low in saturated fat and regular exercise - 30 min at least 5 times per week  Gwyneth Sprout, [place] [address] [place], [place] [address] [phone] (phone) [phone] (fax) [place]

## 2022-02-20 ENCOUNTER — Encounter: Payer: Self-pay | Admitting: Family Medicine

## 2022-02-20 ENCOUNTER — Other Ambulatory Visit: Payer: Self-pay | Admitting: Family Medicine

## 2022-02-20 DIAGNOSIS — I10 Essential (primary) hypertension: Secondary | ICD-10-CM

## 2022-02-20 DIAGNOSIS — E039 Hypothyroidism, unspecified: Secondary | ICD-10-CM

## 2022-02-20 MED ORDER — HYDROCHLOROTHIAZIDE 25 MG PO TABS: 25.0000 mg | ORAL_TABLET | Freq: Every day | ORAL | 3 refills | Status: DC

## 2022-02-20 MED ORDER — LEVOTHYROXINE SODIUM 50 MCG PO TABS: 50.0000 ug | ORAL_TABLET | Freq: Every day | ORAL | 3 refills | Status: DC

## 2022-02-20 MED ORDER — METOPROLOL SUCCINATE ER 25 MG PO TB24: 25.0000 mg | ORAL_TABLET | Freq: Every day | ORAL | 3 refills | Status: DC

## 2022-03-07 NOTE — Progress Notes (Signed)
Established patient visit   Patient: Tami Finley   DOB: [birth year]   56 y.o. Female  MRN: [id number] Visit Date: 03/13/2022  Today's healthcare provider: Gwyneth Sprout, FNP  Introduced to nurse practitioner role and practice setting.  All questions answered.  Discussed provider/patient relationship and expectations.   I,Tiffany J Bragg,acting as a scribe for Gwyneth Sprout, FNP.,have documented all relevant documentation on the behalf of Gwyneth Sprout, FNP,as directed by  Gwyneth Sprout, FNP while in the presence of Gwyneth Sprout, FNP.   Chief Complaint  Patient presents with   Hypertension   Subjective    HPI  Hypertension, follow-up  BP Readings from Last 3 Encounters:  03/13/22 129/88  01/10/22 (!) 149/88  12/26/21 (!) 151/96   Wt Readings from Last 3 Encounters:  03/13/22 149 lb (67.6 kg)  01/10/22 145 lb 11.2 oz (66.1 kg)  12/26/21 147 lb 3.2 oz (66.8 kg)     She was last seen for hypertension 2 months ago.  BP at that visit was 149/88. Management since that visit includes titration to 25 mg of HCTZ.  She reports excellent compliance with treatment. She is not having side effects.  She is following a Regular diet. She is exercising. She does not smoke.  Use of agents associated with hypertension: none.   Outside blood pressures are not checked. Symptoms: No chest pain No chest pressure  No palpitations No syncope  No dyspnea No orthopnea  No paroxysmal nocturnal dyspnea No lower extremity edema   Pertinent labs Lab Results  Component Value Date   CHOL 210 (H) 01/10/2022   HDL 74 01/10/2022   LDLCALC 121 (H) 01/10/2022   TRIG 86 01/10/2022   CHOLHDL 2.8 01/10/2022   Lab Results  Component Value Date   NA 139 01/10/2022   K 4.6 01/10/2022   CREATININE 0.70 01/10/2022   EGFR 103 01/10/2022   GLUCOSE 93 01/10/2022   TSH 0.996 01/10/2022     The 10-year ASCVD risk score (Arnett DK, et al., 2019) is:  1.9%  ---------------------------------------------------------------------------------------------------   Medications: Outpatient Medications Prior to Visit  Medication Sig   Ascorbic Acid (VITAMIN C) 100 MG tablet Take 100 mg by mouth daily.   fluticasone (FLONASE) 50 MCG/ACT nasal spray    hydrochlorothiazide (HYDRODIURIL) 25 MG tablet Take 1 tablet (25 mg total) by mouth daily.   levothyroxine (SYNTHROID) 50 MCG tablet Take 1 tablet (50 mcg total) by mouth daily.   metoprolol succinate (TOPROL-XL) 25 MG 24 hr tablet Take 1 tablet (25 mg total) by mouth daily.   Multiple Vitamins-Minerals (ZINC PO) Take by mouth.   No facility-administered medications prior to visit.   Review of Systems    Objective    BP 129/88 (BP Location: Right Arm, Patient Position: Sitting, Cuff Size: Normal)   Pulse 76   Temp 98 F (36.7 C)   Ht $R'5\' 5"'xb$  (1.651 m)   Wt 149 lb (67.6 kg)   LMP 07/23/2003 Comment: IUD.  LMP 12 yrs ago.  SpO2 99%   BMI 24.79 kg/m   BP Readings from Last 3 Encounters:  03/13/22 129/88  01/10/22 (!) 149/88  12/26/21 (!) 151/96   Wt Readings from Last 3 Encounters:  03/13/22 149 lb (67.6 kg)  01/10/22 145 lb 11.2 oz (66.1 kg)  12/26/21 147 lb 3.2 oz (66.8 kg)   Physical Exam Vitals and nursing note reviewed.  Constitutional:      General: She is not in  acute distress.    Appearance: Normal appearance. She is normal weight. She is not ill-appearing, toxic-appearing or diaphoretic.  HENT:     Head: Normocephalic and atraumatic.  Cardiovascular:     Rate and Rhythm: Normal rate and regular rhythm.     Pulses: Normal pulses.     Heart sounds: Normal heart sounds. No murmur heard.    No friction rub. No gallop.  Pulmonary:     Effort: Pulmonary effort is normal. No respiratory distress.     Breath sounds: Normal breath sounds. No stridor. No wheezing, rhonchi or rales.  Chest:     Chest wall: No tenderness.  Musculoskeletal:        General: No swelling,  tenderness, deformity or signs of injury. Normal range of motion.     Right lower leg: No edema.     Left lower leg: No edema.  Skin:    General: Skin is warm and dry.     Capillary Refill: Capillary refill takes less than 2 seconds.     Coloration: Skin is not jaundiced or pale.     Findings: No bruising, erythema, lesion or rash.  Neurological:     General: No focal deficit present.     Mental Status: She is alert and oriented to person, place, and time. Mental status is at baseline.     Cranial Nerves: No cranial nerve deficit.     Sensory: No sensory deficit.     Motor: No weakness.     Coordination: Coordination normal.  Psychiatric:        Mood and Affect: Mood normal.        Behavior: Behavior normal.        Thought Content: Thought content normal.        Judgment: Judgment normal.     No results found for any visits on 03/13/22.  Assessment & Plan     Problem List Items Addressed This Visit       Cardiovascular and Mediastinum   Hypertension - Primary    Chronic, improved; now at goal of <140/<90 Continue 25 mg dose of HCTZ Patient declines any side effects; has no complaints No edema noted on exam; remainder of objective data relatively unchanged. OK to return with physical next year; refills already placed.        Other   Need for shingles vaccine   Relevant Orders   Zoster Recombinant (Shingrix )   Return in about 10 months (around 01/12/2023) for annual examination.      Vonna Kotyk, FNP, have reviewed all documentation for this visit. The documentation on 03/13/22 for the exam, diagnosis, procedures, and orders are all accurate and complete.  Gwyneth Sprout, [place] [phone] (phone) [phone] (fax)  [place]

## 2022-03-13 ENCOUNTER — Ambulatory Visit (INDEPENDENT_AMBULATORY_CARE_PROVIDER_SITE_OTHER): Payer: No Typology Code available for payment source | Admitting: Family Medicine

## 2022-03-13 ENCOUNTER — Encounter: Payer: Self-pay | Admitting: Family Medicine

## 2022-03-13 VITALS — BP 129/88 | HR 76 | Temp 98.0°F | Ht 65.0 in | Wt 149.0 lb

## 2022-03-13 DIAGNOSIS — Z23 Encounter for immunization: Secondary | ICD-10-CM | POA: Diagnosis not present

## 2022-03-13 DIAGNOSIS — I10 Essential (primary) hypertension: Secondary | ICD-10-CM

## 2022-03-13 NOTE — Assessment & Plan Note (Signed)
Chronic, improved; now at goal of <140/<90 Continue 25 mg dose of HCTZ Patient declines any side effects; has no complaints No edema noted on exam; remainder of objective data relatively unchanged. OK to return with physical next year; refills already placed.

## 2022-03-29 ENCOUNTER — Encounter: Payer: Self-pay | Admitting: Family Medicine

## 2022-07-15 ENCOUNTER — Encounter: Payer: Self-pay | Admitting: Family Medicine

## 2022-07-22 ENCOUNTER — Ambulatory Visit (INDEPENDENT_AMBULATORY_CARE_PROVIDER_SITE_OTHER): Payer: Managed Care, Other (non HMO) | Admitting: Family Medicine

## 2022-07-22 ENCOUNTER — Encounter: Payer: Self-pay | Admitting: Family Medicine

## 2022-07-22 VITALS — BP 133/75 | HR 109 | Ht 65.0 in | Wt 159.0 lb

## 2022-07-22 DIAGNOSIS — R0789 Other chest pain: Secondary | ICD-10-CM | POA: Diagnosis not present

## 2022-07-22 DIAGNOSIS — F419 Anxiety disorder, unspecified: Secondary | ICD-10-CM

## 2022-07-22 DIAGNOSIS — I10 Essential (primary) hypertension: Secondary | ICD-10-CM

## 2022-07-22 DIAGNOSIS — E039 Hypothyroidism, unspecified: Secondary | ICD-10-CM

## 2022-07-22 DIAGNOSIS — E785 Hyperlipidemia, unspecified: Secondary | ICD-10-CM

## 2022-07-22 DIAGNOSIS — E559 Vitamin D deficiency, unspecified: Secondary | ICD-10-CM

## 2022-07-22 DIAGNOSIS — R739 Hyperglycemia, unspecified: Secondary | ICD-10-CM

## 2022-07-22 NOTE — Assessment & Plan Note (Signed)
Chronic, previously elevated Repeat LP given complaints of chest pressure BP stable

## 2022-07-22 NOTE — Assessment & Plan Note (Signed)
Repeat A1c; Continue to recommend balanced, lower carb meals. Smaller meal size, adding snacks. Choosing water as drink of choice and increasing purposeful exercise. Previously borderline at 5.6%

## 2022-07-22 NOTE — Assessment & Plan Note (Signed)
Previous elevated; repeat labs in setting of chest pressure and concern for anxiety/panic attacks

## 2022-07-22 NOTE — Progress Notes (Signed)
Established patient visit   Patient: Tami Finley   DOB: [birth year]   57 y.o. Female  MRN: [id number] Visit Date: 07/22/2022  Today's healthcare provider: Gwyneth Sprout, FNP  Re Introduced to nurse practitioner role and practice setting.  All questions answered.  Discussed provider/patient relationship and expectations.   Subjective    HPI HPI   Pt stated--having tightness in chest and upper back pain --3 weeks. Last edited by Elta Guadeloupe, CMA on 07/22/2022  1:05 PM.      Chest Pain: Patient complains of chest pain. Onset was 3 weeks ago, with stable course since that time. The patient describes the pain as intermittent, occurring with increasing frequency, pressure like in nature, does not radiate. Patient rates pain as a 4/10 in intensity.  Associated symptoms are chest pressure/discomfort and fatigue. Aggravating factors are none.  Alleviating factors are: none. Patient's cardiac risk factors are family history of premature cardiovascular disease.  Patient's risk factors for DVT/PE: none. Previous cardiac testing: none.   Medications: Outpatient Medications Prior to Visit  Medication Sig   Ascorbic Acid (VITAMIN C) 100 MG tablet Take 100 mg by mouth daily.   fluticasone (FLONASE) 50 MCG/ACT nasal spray    hydrochlorothiazide (HYDRODIURIL) 25 MG tablet Take 1 tablet (25 mg total) by mouth daily.   levothyroxine (SYNTHROID) 50 MCG tablet Take 1 tablet (50 mcg total) by mouth daily.   metoprolol succinate (TOPROL-XL) 25 MG 24 hr tablet Take 1 tablet (25 mg total) by mouth daily.   Multiple Vitamins-Minerals (ZINC PO) Take by mouth.   No facility-administered medications prior to visit.    Review of Systems     Objective    BP 133/75 (BP Location: Right Arm, Patient Position: Sitting, Cuff Size: Normal)   Pulse (!) 109   Ht '5\' 5"'$  (1.651 m)   Wt 159 lb (72.1 kg)   LMP 07/23/2003 Comment: IUD.  LMP 12 yrs ago.  SpO2 100%   BMI 26.46 kg/m    Physical Exam Vitals  and nursing note reviewed.  Constitutional:      General: She is not in acute distress.    Appearance: Normal appearance. She is overweight. She is not ill-appearing, toxic-appearing or diaphoretic.  HENT:     Head: Normocephalic and atraumatic.  Cardiovascular:     Rate and Rhythm: Regular rhythm. Tachycardia present.     Pulses: Normal pulses.     Heart sounds: Normal heart sounds. No murmur heard.    No friction rub. No gallop.  Pulmonary:     Effort: Pulmonary effort is normal. No respiratory distress.     Breath sounds: Normal breath sounds. No stridor. No wheezing, rhonchi or rales.  Chest:     Chest wall: No tenderness.  Musculoskeletal:        General: No swelling, tenderness, deformity or signs of injury. Normal range of motion.     Right lower leg: No edema.     Left lower leg: No edema.  Skin:    General: Skin is warm and dry.     Capillary Refill: Capillary refill takes less than 2 seconds.     Coloration: Skin is not jaundiced or pale.     Findings: No bruising, erythema, lesion or rash.  Neurological:     General: No focal deficit present.     Mental Status: She is alert and oriented to person, place, and time. Mental status is at baseline.     Cranial Nerves: No  cranial nerve deficit.     Sensory: No sensory deficit.     Motor: No weakness.     Coordination: Coordination normal.  Psychiatric:        Mood and Affect: Mood normal.        Behavior: Behavior normal.        Thought Content: Thought content normal.        Judgment: Judgment normal.    No results found for any visits on 07/22/22.  Assessment & Plan     Problem List Items Addressed This Visit       Cardiovascular and Mediastinum   Hypertension    Chronic, stable BP at goal of <140/<90 Continue to monitor in setting of chest pressure      Relevant Orders   CBC with Differential/Platelet   Comprehensive Metabolic Panel (CMET)     Endocrine   Hypothyroidism    Chronic, previously  subclinical Repeat labs given complaints of chest pressure and noted tachycardia       Relevant Orders   TSH + free T4     Other   Anxiety    Chronic, previously has failed multiple meds including wellbutrin, zoloft, lexapro, buspar  Declines use of medication at this time  Continue to monitor       Avitaminosis D    Previous elevated; repeat labs in setting of chest pressure and concern for anxiety/panic attacks       Relevant Orders   Vitamin D (25 hydroxy)   Blood glucose elevated    Repeat A1c; Continue to recommend balanced, lower carb meals. Smaller meal size, adding snacks. Choosing water as drink of choice and increasing purposeful exercise. Previously borderline at 5.6%      Relevant Orders   Hemoglobin A1c   Hyperlipidemia    Chronic, previously elevated Repeat LP given complaints of chest pressure BP stable       Relevant Orders   Lipid panel   Sensation of chest pressure - Primary    Acute, self limiting Pt reports excessive stressors for the past 3-4 months; however, notes recently feeling torn between helping her dad who is in a SNF vs looking for a job as she was cut from her previous position in 1/24 Normal exam; not reproducible; limited systemic effects       Relevant Orders   Magnesium   Return if symptoms worsen or fail to improve.     Vonna Kotyk, FNP, have reviewed all documentation for this visit. The documentation on 07/22/22 for the exam, diagnosis, procedures, and orders are all accurate and complete.  Gwyneth Sprout, [place] [phone] (phone) [phone] (fax)  [place]

## 2022-07-22 NOTE — Assessment & Plan Note (Signed)
Chronic, previously subclinical Repeat labs given complaints of chest pressure and noted tachycardia

## 2022-07-22 NOTE — Assessment & Plan Note (Signed)
Acute, self limiting Pt reports excessive stressors for the past 3-4 months; however, notes recently feeling torn between helping her dad who is in a SNF vs looking for a job as she was cut from her previous position in 1/24 Normal exam; not reproducible; limited systemic effects

## 2022-07-22 NOTE — Assessment & Plan Note (Signed)
Chronic, previously has failed multiple meds including wellbutrin, zoloft, lexapro, buspar  Declines use of medication at this time  Continue to monitor

## 2022-07-22 NOTE — Patient Instructions (Signed)
Red flags reviewed with patient- Seek emergent care if needed. Ww eill start with lab work at this time given benign examination.

## 2022-07-22 NOTE — Assessment & Plan Note (Signed)
Chronic, stable BP at goal of <140/<90 Continue to monitor in setting of chest pressure

## 2022-07-23 LAB — CBC WITH DIFFERENTIAL/PLATELET
Basophils Absolute: 0 10*3/uL (ref 0.0–0.2)
Basos: 1 %
EOS (ABSOLUTE): 0 10*3/uL (ref 0.0–0.4)
Eos: 1 %
Hematocrit: 34.9 % (ref 34.0–46.6)
Hemoglobin: 11.5 g/dL (ref 11.1–15.9)
Immature Grans (Abs): 0 10*3/uL (ref 0.0–0.1)
Immature Granulocytes: 0 %
Lymphocytes Absolute: 2.7 10*3/uL (ref 0.7–3.1)
Lymphs: 46 %
MCH: 29.3 pg (ref 26.6–33.0)
MCHC: 33 g/dL (ref 31.5–35.7)
MCV: 89 fL (ref 79–97)
Monocytes Absolute: 0.4 10*3/uL (ref 0.1–0.9)
Monocytes: 6 %
Neutrophils Absolute: 2.8 10*3/uL (ref 1.4–7.0)
Neutrophils: 46 %
Platelets: 345 10*3/uL (ref 150–450)
RBC: 3.92 x10E6/uL (ref 3.77–5.28)
RDW: 13.2 % (ref 11.7–15.4)
WBC: 5.9 10*3/uL (ref 3.4–10.8)

## 2022-07-23 LAB — COMPREHENSIVE METABOLIC PANEL
ALT: 20 IU/L (ref 0–32)
AST: 20 IU/L (ref 0–40)
Albumin/Globulin Ratio: 2 (ref 1.2–2.2)
Albumin: 4.7 g/dL (ref 3.8–4.9)
Alkaline Phosphatase: 136 IU/L — ABNORMAL HIGH (ref 44–121)
BUN/Creatinine Ratio: 21 (ref 9–23)
BUN: 15 mg/dL (ref 6–24)
Bilirubin Total: <0.2 mg/dL (ref 0.0–1.2)
CO2: 23 mmol/L (ref 20–29)
Calcium: 10 mg/dL (ref 8.7–10.2)
Chloride: 101 mmol/L (ref 96–106)
Creatinine, Ser: 0.71 mg/dL (ref 0.57–1.00)
Globulin, Total: 2.3 g/dL (ref 1.5–4.5)
Glucose: 105 mg/dL — ABNORMAL HIGH (ref 70–99)
Potassium: 4 mmol/L (ref 3.5–5.2)
Sodium: 141 mmol/L (ref 134–144)
Total Protein: 7 g/dL (ref 6.0–8.5)
eGFR: 100 mL/min/{1.73_m2} (ref >59)

## 2022-07-23 LAB — HEMOGLOBIN A1C
Est. average glucose Bld gHb Est-mCnc: 117 mg/dL
Hgb A1c MFr Bld: 5.7 % — ABNORMAL HIGH (ref 4.8–5.6)

## 2022-07-23 LAB — LIPID PANEL
Chol/HDL Ratio: 3.2 ratio (ref 0.0–4.4)
Cholesterol, Total: 239 mg/dL — ABNORMAL HIGH (ref 100–199)
HDL: 74 mg/dL (ref >39)
LDL Chol Calc (NIH): 128 mg/dL — ABNORMAL HIGH (ref 0–99)
Triglycerides: 210 mg/dL — ABNORMAL HIGH (ref 0–149)
VLDL Cholesterol Cal: 37 mg/dL (ref 5–40)

## 2022-07-23 LAB — TSH+FREE T4
Free T4: 1.69 ng/dL (ref 0.82–1.77)
TSH: 0.917 u[IU]/mL (ref 0.450–4.500)

## 2022-07-23 LAB — MAGNESIUM: Magnesium: 1.8 mg/dL (ref 1.6–2.3)

## 2022-07-23 LAB — VITAMIN D 25 HYDROXY (VIT D DEFICIENCY, FRACTURES): Vit D, 25-Hydroxy: 48.5 ng/mL (ref 30.0–100.0)

## 2022-07-23 NOTE — Progress Notes (Signed)
Labs relatively stable; change noted in cholesterol and sugar average. Sugar average now notes pre-diabetes. Despite elevation in cholesterol; stable stroke/MI risk. Continue to recommend balanced, lower carb meals. Smaller meal size, adding snacks. Choosing water as drink of choice and increasing purposeful exercise. The 10-year ASCVD risk score (Arnett DK, et al., 2019) is: 2.6%   Values used to calculate the score:     Age: 57 years     Sex: Female     Is Non-Hispanic African American: No     Diabetic: No     Tobacco smoker: No     Systolic Blood Pressure: Q000111Q mmHg     Is BP treated: Yes     HDL Cholesterol: 74 mg/dL     Total Cholesterol: 239 mg/dL

## 2022-07-30 ENCOUNTER — Encounter: Payer: Self-pay | Admitting: Family Medicine

## 2022-07-30 DIAGNOSIS — Z1231 Encounter for screening mammogram for malignant neoplasm of breast: Secondary | ICD-10-CM

## 2022-08-06 ENCOUNTER — Other Ambulatory Visit: Payer: Self-pay | Admitting: Family Medicine

## 2022-08-16 ENCOUNTER — Ambulatory Visit
Admission: RE | Admit: 2022-08-16 | Discharge: 2022-08-16 | Disposition: A | Discharge status: Home / Self Care | Payer: Managed Care, Other (non HMO) | Source: Ambulatory Visit | Attending: Family Medicine | Admitting: Family Medicine

## 2022-08-16 DIAGNOSIS — Z1231 Encounter for screening mammogram for malignant neoplasm of breast: Secondary | ICD-10-CM | POA: Insufficient documentation

## 2022-08-20 ENCOUNTER — Other Ambulatory Visit: Payer: Self-pay | Admitting: Family Medicine

## 2022-08-20 DIAGNOSIS — R928 Other abnormal and inconclusive findings on diagnostic imaging of breast: Secondary | ICD-10-CM

## 2022-08-20 DIAGNOSIS — N6489 Other specified disorders of breast: Secondary | ICD-10-CM

## 2022-08-22 ENCOUNTER — Ambulatory Visit
Admission: RE | Admit: 2022-08-22 | Discharge: 2022-08-22 | Disposition: A | Discharge status: Home / Self Care | Payer: Managed Care, Other (non HMO) | Source: Ambulatory Visit | Attending: Family Medicine | Admitting: Family Medicine

## 2022-08-22 DIAGNOSIS — N6489 Other specified disorders of breast: Secondary | ICD-10-CM

## 2022-08-22 DIAGNOSIS — R928 Other abnormal and inconclusive findings on diagnostic imaging of breast: Secondary | ICD-10-CM | POA: Diagnosis present

## 2022-08-27 ENCOUNTER — Encounter: Payer: No Typology Code available for payment source | Admitting: Dermatology

## 2022-09-24 ENCOUNTER — Ambulatory Visit (INDEPENDENT_AMBULATORY_CARE_PROVIDER_SITE_OTHER): Payer: Managed Care, Other (non HMO) | Admitting: Dermatology

## 2022-09-24 VITALS — BP 155/96 | HR 76

## 2022-09-24 DIAGNOSIS — Z1283 Encounter for screening for malignant neoplasm of skin: Secondary | ICD-10-CM

## 2022-09-24 DIAGNOSIS — D2272 Melanocytic nevi of left lower limb, including hip: Secondary | ICD-10-CM | POA: Diagnosis not present

## 2022-09-24 DIAGNOSIS — L578 Other skin changes due to chronic exposure to nonionizing radiation: Secondary | ICD-10-CM

## 2022-09-24 DIAGNOSIS — D2239 Melanocytic nevi of other parts of face: Secondary | ICD-10-CM

## 2022-09-24 DIAGNOSIS — Z86018 Personal history of other benign neoplasm: Secondary | ICD-10-CM

## 2022-09-24 DIAGNOSIS — D229 Melanocytic nevi, unspecified: Secondary | ICD-10-CM

## 2022-09-24 DIAGNOSIS — D225 Melanocytic nevi of trunk: Secondary | ICD-10-CM

## 2022-09-24 DIAGNOSIS — L821 Other seborrheic keratosis: Secondary | ICD-10-CM | POA: Diagnosis not present

## 2022-09-24 NOTE — Patient Instructions (Addendum)
     Melanoma ABCDEs  Melanoma is the most dangerous type of skin cancer, and is the leading cause of death from skin disease.  You are more likely to develop melanoma if you: Have light-colored skin, light-colored eyes, or red or blond hair Spend a lot of time in the sun Tan regularly, either outdoors or in a tanning bed Have had blistering sunburns, especially during childhood Have a close family member who has had a melanoma Have atypical moles or large birthmarks  Early detection of melanoma is key since treatment is typically straightforward and cure rates are extremely high if we catch it early.   The first sign of melanoma is often a change in a mole or a new dark spot.  The ABCDE system is a way of remembering the signs of melanoma.  A for asymmetry:  The two halves do not match. B for border:  The edges of the growth are irregular. C for color:  A mixture of colors are present instead of an even brown color. D for diameter:  Melanomas are usually (but not always) greater than 6mm - the size of a pencil eraser. E for evolution:  The spot keeps changing in size, shape, and color.  Please check your skin once per month between visits. You can use a small mirror in front and a large mirror behind you to keep an eye on the back side or your body.   If you see any new or changing lesions before your next follow-up, please call to schedule a visit.  Please continue daily skin protection including broad spectrum sunscreen SPF 30+ to sun-exposed areas, reapplying every 2 hours as needed when you're outdoors.   Staying in the shade or wearing long sleeves, sun glasses (UVA+UVB protection) and wide brim hats (4-inch brim around the entire circumference of the hat) are also recommended for sun protection.    Due to recent changes in healthcare laws, you may see results of your pathology and/or laboratory studies on MyChart before the doctors have had a chance to review them. We  understand that in some cases there may be results that are confusing or concerning to you. Please understand that not all results are received at the same time and often the doctors may need to interpret multiple results in order to provide you with the best plan of care or course of treatment. Therefore, we ask that you please give us 2 business days to thoroughly review all your results before contacting the office for clarification. Should we see a critical lab result, you will be contacted sooner.   If You Need Anything After Your Visit  If you have any questions or concerns for your doctor, please call our main line at [phone] and press option 4 to reach your doctor's medical assistant. If no one answers, please leave a voicemail as directed and we will return your call as soon as possible. Messages left after 4 pm will be answered the following business day.   You may also send us a message via MyChart. We typically respond to MyChart messages within 1-2 business days.  For prescription refills, please ask your pharmacy to contact our office. Our fax number is [phone].  If you have an urgent issue when the clinic is closed that cannot wait until the next business day, you can page your doctor at the number below.    Please note that while we do our best to be available for urgent issues   outside of office hours, we are not available 24/7.   If you have an urgent issue and are unable to reach us, you may choose to seek medical care at your doctor's office, retail clinic, urgent care center, or emergency room.  If you have a medical emergency, please immediately call 911 or go to the emergency department.  Pager Numbers  - Dr. Kowalski: [phone]  - Dr. Moye: [phone]  - Dr. Stewart: [phone]  In the event of inclement weather, please call our main line at [phone] for an update on the status of any delays or closures.  Dermatology Medication Tips: Please  keep the boxes that topical medications come in in order to help keep track of the instructions about where and how to use these. Pharmacies typically print the medication instructions only on the boxes and not directly on the medication tubes.   If your medication is too expensive, please contact our office at [phone] option 4 or send us a message through MyChart.   We are unable to tell what your co-pay for medications will be in advance as this is different depending on your insurance coverage. However, we may be able to find a substitute medication at lower cost or fill out paperwork to get insurance to cover a needed medication.   If a prior authorization is required to get your medication covered by your insurance company, please allow us 1-2 business days to complete this process.  Drug prices often vary depending on where the prescription is filled and some pharmacies may offer cheaper prices.  The website www.goodrx.com contains coupons for medications through different pharmacies. The prices here do not account for what the cost may be with help from insurance (it may be cheaper with your insurance), but the website can give you the price if you did not use any insurance.  - You can print the associated coupon and take it with your prescription to the pharmacy.  - You may also stop by our office during regular business hours and pick up a GoodRx coupon card.  - If you need your prescription sent electronically to a different pharmacy, notify our office through [facility] MyChart or by phone at [phone] option 4.     Si Usted Necesita Algo Despus de Su Visita  Tambin puede enviarnos un mensaje a travs de MyChart. Por lo general respondemos a los mensajes de MyChart en el transcurso de 1 a 2 das hbiles.  Para renovar recetas, por favor pida a su farmacia que se ponga en contacto con nuestra oficina. Nuestro nmero de fax es el [phone].  Si tiene un asunto urgente  cuando la clnica est cerrada y que no puede esperar hasta el siguiente da hbil, puede llamar/localizar a su doctor(a) al nmero que aparece a continuacin.   Por favor, tenga en cuenta que aunque hacemos todo lo posible para estar disponibles para asuntos urgentes fuera del horario de oficina, no estamos disponibles las 24 horas del da, los 7 das de la semana.   Si tiene un problema urgente y no puede comunicarse con nosotros, puede optar por buscar atencin mdica  en el consultorio de su doctor(a), en una clnica privada, en un centro de atencin urgente o en una sala de emergencias.  Si tiene una emergencia mdica, por favor llame inmediatamente al 911 o vaya a la sala de emergencias.  Nmeros de bper  - Dr. Kowalski: [phone]  - Dra. Moye: [phone]  - Dra. Stewart: [phone]  En caso   de inclemencias del tiempo, por favor llame a nuestra lnea principal al [phone] para una actualizacin sobre el estado de cualquier retraso o cierre.  Consejos para la medicacin en dermatologa: Por favor, guarde las cajas en las que vienen los medicamentos de uso tpico para ayudarle a seguir las instrucciones sobre dnde y cmo usarlos. Las farmacias generalmente imprimen las instrucciones del medicamento slo en las cajas y no directamente en los tubos del medicamento.   Si su medicamento es muy caro, por favor, pngase en contacto con nuestra oficina llamando al [phone] y presione la opcin 4 o envenos un mensaje a travs de MyChart.   No podemos decirle cul ser su copago por los medicamentos por adelantado ya que esto es diferente dependiendo de la cobertura de su seguro. Sin embargo, es posible que podamos encontrar un medicamento sustituto a menor costo o llenar un formulario para que el seguro cubra el medicamento que se considera necesario.   Si se requiere una autorizacin previa para que su compaa de seguros cubra su medicamento, por favor permtanos de 1 a 2  das hbiles para completar este proceso.  Los precios de los medicamentos varan con frecuencia dependiendo del lugar de dnde se surte la receta y alguna farmacias pueden ofrecer precios ms baratos.  El sitio web www.goodrx.com tiene cupones para medicamentos de diferentes farmacias. Los precios aqu no tienen en cuenta lo que podra costar con la ayuda del seguro (puede ser ms barato con su seguro), pero el sitio web puede darle el precio si no utiliz ningn seguro.  - Puede imprimir el cupn correspondiente y llevarlo con su receta a la farmacia.  - Tambin puede pasar por nuestra oficina durante el horario de atencin regular y recoger una tarjeta de cupones de GoodRx.  - Si necesita que su receta se enve electrnicamente a una farmacia diferente, informe a nuestra oficina a travs de MyChart de  o por telfono llamando al [phone] y presione la opcin 4.  

## 2022-09-24 NOTE — Progress Notes (Signed)
   Follow-Up Visit   Subjective  Tami Finley is a 57 y.o. female who presents for the following: Skin Cancer Screening and Full Body Skin Exam  The patient presents for Total-Body Skin Exam (TBSE) for skin cancer screening and mole check. The patient has spots, moles and lesions to be evaluated, some may be new or changing and the patient has concerns that these could be cancer.    The following portions of the chart were reviewed this encounter and updated as appropriate: medications, allergies, medical history  Review of Systems:  No other skin or systemic complaints except as noted in HPI or Assessment and Plan.  Objective  Well appearing patient in no apparent distress; mood and affect are within normal limits.  A full examination was performed including scalp, head, eyes, ears, nose, lips, neck, chest, axillae, abdomen, back, buttocks, bilateral upper extremities, bilateral lower extremities, hands, feet, fingers, toes, fingernails, and toenails. All findings within normal limits unless otherwise noted below.   Relevant physical exam findings are noted in the Assessment and Plan.             Assessment & Plan   LENTIGINES, SEBORRHEIC KERATOSES (including R mid back - discussed LN2 vs shave if becomes bothersome), HEMANGIOMAS - Benign normal skin lesions - Benign-appearing - Call for any changes  MELANOCYTIC NEVI - Tan-brown and/or pink-flesh-colored symmetric macules and papules  0.2cm medium dark brown macule at left post thigh 0.35cm med dark brown macule at left pretibia 0.3cm med brown papule at left great toe 0.3cm medium light brown papule at left 3rd toe 0.4cm flesh papule at right nasal ala 4 x 3 mm speckled brown macule at right upper back  - Benign appearing on exam today - Observation - Call clinic for new or changing moles - Recommend daily use of broad spectrum spf 30+ sunscreen to sun-exposed areas.   ACTINIC DAMAGE - Chronic condition,  secondary to cumulative UV/sun exposure - diffuse scaly erythematous macules with underlying dyspigmentation - Recommend daily broad spectrum sunscreen SPF 30+ to sun-exposed areas, reapply every 2 hours as needed.  - Staying in the shade or wearing long sleeves, sun glasses (UVA+UVB protection) and wide brim hats (4-inch brim around the entire circumference of the hat) are also recommended for sun protection.  - Call for new or changing lesions.  SKIN CANCER SCREENING PERFORMED TODAY.  History of Dysplastic Nevus - No evidence of recurrence today of the right foot dorsum - Recommend regular full body skin exams - Recommend daily broad spectrum sunscreen SPF 30+ to sun-exposed areas, reapply every 2 hours as needed.  - Call if any new or changing lesions are noted between office visits    Return in about 1 year (around 09/24/2023) for TBSE, Hx Dysplastic Nevus.  ICherlyn Labella, CMA, am acting as scribe for Willeen Niece, MD .   Documentation: I have reviewed the above documentation for accuracy and completeness, and I agree with the above.  Willeen Niece, MD

## 2022-12-27 ENCOUNTER — Other Ambulatory Visit: Payer: Self-pay | Admitting: Family Medicine

## 2022-12-27 NOTE — Telephone Encounter (Signed)
Requested Prescriptions  Pending Prescriptions Disp Refills   hydrochlorothiazide (HYDRODIURIL) 25 MG tablet [Pharmacy Med Name: HYDROCHLOROTHIAZIDE 25 MG TAB] 90 tablet 0    Sig: TAKE 1 TABLET (25 MG TOTAL) BY MOUTH DAILY.     Cardiovascular: Diuretics - Thiazide Failed - 12/27/2022  2:24 AM      Failed - Last BP in normal range    BP Readings from Last 1 Encounters:  09/24/22 (!) 155/96         Passed - Cr in normal range and within 180 days    Creatinine, Ser  Date Value Ref Range Status  07/22/2022 0.71 0.57 - 1.00 mg/dL Final         Passed - K in normal range and within 180 days    Potassium  Date Value Ref Range Status  07/22/2022 4.0 3.5 - 5.2 mmol/L Final         Passed - Na in normal range and within 180 days    Sodium  Date Value Ref Range Status  07/22/2022 141 134 - 144 mmol/L Final         Passed - Valid encounter within last 6 months    Recent Outpatient Visits           5 months ago Sensation of chest pressure   [place] [facility] Jacky Kindle, FNP   9 months ago Primary hypertension   [place] [facility] Jacky Kindle, FNP   11 months ago Primary hypertension   [place] [facility] Merita Norton T, FNP   1 year ago Primary hypertension   [place] [facility] Mecum, Oswaldo Conroy, PA-C   2 years ago Essential hypertension   [place] [facility] Chrismon, Jodell Cipro, PA-C       Future Appointments             In 9 months Willeen Niece, MD [facility]

## 2023-03-27 ENCOUNTER — Other Ambulatory Visit: Payer: Self-pay | Admitting: Family Medicine

## 2023-03-27 DIAGNOSIS — I10 Essential (primary) hypertension: Secondary | ICD-10-CM

## 2023-03-27 DIAGNOSIS — E039 Hypothyroidism, unspecified: Secondary | ICD-10-CM

## 2023-03-27 NOTE — Telephone Encounter (Signed)
Requested Prescriptions  Pending Prescriptions Disp Refills   levothyroxine (SYNTHROID) 50 MCG tablet [Pharmacy Med Name: LEVOTHYROXINE 50 MCG TABLET] 90 tablet 2    Sig: TAKE 1 TABLET BY MOUTH EVERY DAY     Endocrinology:  Hypothyroid Agents Passed - 03/27/2023  2:42 AM      Passed - TSH in normal range and within 360 days    TSH  Date Value Ref Range Status  07/22/2022 0.917 0.450 - 4.500 uIU/mL Final         Passed - Valid encounter within last 12 months    Recent Outpatient Visits           8 months ago Sensation of chest pressure   [place] [facility] Merita Norton T, FNP   1 year ago Primary hypertension   [place] [facility] Jacky Kindle, FNP   1 year ago Primary hypertension   [place] [facility] Jacky Kindle, FNP   1 year ago Primary hypertension   [place] [facility] Mecum, Oswaldo Conroy, PA-C   2 years ago Essential hypertension   [place] [facility] Chrismon, Jodell Cipro, PA-C       Future Appointments             In 1 week Jacky Kindle, FNP [facility]   In 6 months Willeen Niece, MD [place]             metoprolol succinate (TOPROL-XL) 25 MG 24 hr tablet [Pharmacy Med Name: METOPROLOL SUCC ER 25 MG TAB] 90 tablet 0    Sig: TAKE 1 TABLET (25 MG TOTAL) BY MOUTH DAILY.     Cardiovascular:  Beta Blockers Failed - 03/27/2023  2:42 AM      Failed - Last BP in normal range    BP Readings from Last 1 Encounters:  09/24/22 (!) 155/96         Failed - Valid encounter within last 6 months    Recent Outpatient Visits           8 months ago Sensation of chest pressure   [place] [facility] Merita Norton T, FNP   1 year ago Primary hypertension   [place] [facility] Jacky Kindle, FNP   1 year ago Primary hypertension   [place] [facility] Jacky Kindle, FNP    1 year ago Primary hypertension   [place] [facility] Mecum, Oswaldo Conroy, PA-C   2 years ago Essential hypertension    [facility] Chrismon, Jodell Cipro, PA-C       Future Appointments             In 1 week Jacky Kindle, FNP [facility]   In 6 months Willeen Niece, MD [facility]            Passed - Last Heart Rate in normal range    Pulse Readings from Last 1 Encounters:  09/24/22 76          hydrochlorothiazide (HYDRODIURIL) 25 MG tablet [Pharmacy Med Name: HYDROCHLOROTHIAZIDE 25 MG TAB] 90 tablet 0    Sig: TAKE 1 TABLET (25 MG TOTAL) BY MOUTH DAILY.     Cardiovascular: Diuretics - Thiazide Failed - 03/27/2023  2:42 AM      Failed - Cr in normal range and within 180 days  Creatinine, Ser  Date Value Ref Range Status  07/22/2022 0.71 0.57 - 1.00 mg/dL Final         Failed - K in normal range and within 180 days    Potassium  Date Value Ref Range Status  07/22/2022 4.0 3.5 - 5.2 mmol/L Final         Failed - Na in normal range and within 180 days    Sodium  Date Value Ref Range Status  07/22/2022 141 134 - 144 mmol/L Final         Failed - Last BP in normal range    BP Readings from Last 1 Encounters:  09/24/22 (!) 155/96         Failed - Valid encounter within last 6 months    Recent Outpatient Visits           8 months ago Sensation of chest pressure   [place] [facility] Jacky Kindle, FNP   1 year ago Primary hypertension   [place] [facility] Jacky Kindle, FNP   1 year ago Primary hypertension   [place] [facility] Merita Norton T, FNP   1 year ago Primary hypertension   [place] [facility] Mecum, Oswaldo Conroy, PA-C   2 years ago Essential hypertension   [place] [facility] Chrismon, Jodell Cipro, PA-C       Future Appointments             In 1 week Jacky Kindle, FNP [facility]   In 6 months Willeen Niece, MD [facility]

## 2023-03-27 NOTE — Telephone Encounter (Signed)
Called pt and made follow up appt. 

## 2023-04-09 ENCOUNTER — Ambulatory Visit: Payer: No Typology Code available for payment source | Admitting: Family Medicine

## 2023-04-15 ENCOUNTER — Ambulatory Visit (INDEPENDENT_AMBULATORY_CARE_PROVIDER_SITE_OTHER): Payer: Managed Care, Other (non HMO) | Admitting: Family Medicine

## 2023-04-15 ENCOUNTER — Encounter: Payer: Self-pay | Admitting: Family Medicine

## 2023-04-15 VITALS — BP 145/79 | HR 89 | Ht 65.0 in | Wt 165.5 lb

## 2023-04-15 DIAGNOSIS — E559 Vitamin D deficiency, unspecified: Secondary | ICD-10-CM | POA: Diagnosis not present

## 2023-04-15 DIAGNOSIS — L4050 Arthropathic psoriasis, unspecified: Secondary | ICD-10-CM | POA: Insufficient documentation

## 2023-04-15 DIAGNOSIS — I10 Essential (primary) hypertension: Secondary | ICD-10-CM | POA: Diagnosis not present

## 2023-04-15 DIAGNOSIS — F411 Generalized anxiety disorder: Secondary | ICD-10-CM | POA: Insufficient documentation

## 2023-04-15 DIAGNOSIS — R7303 Prediabetes: Secondary | ICD-10-CM | POA: Insufficient documentation

## 2023-04-15 DIAGNOSIS — E782 Mixed hyperlipidemia: Secondary | ICD-10-CM

## 2023-04-15 DIAGNOSIS — K2100 Gastro-esophageal reflux disease with esophagitis, without bleeding: Secondary | ICD-10-CM | POA: Insufficient documentation

## 2023-04-15 DIAGNOSIS — Z1211 Encounter for screening for malignant neoplasm of colon: Secondary | ICD-10-CM

## 2023-04-15 MED ORDER — DULOXETINE HCL 30 MG PO CPEP: 30.0000 mg | ORAL_CAPSULE | Freq: Every day | ORAL | 0 refills | Status: DC

## 2023-04-15 NOTE — Assessment & Plan Note (Signed)
Acute on chronic, untreated Repeat labs Trial of cymbalta to assist 6 week f/u recommended

## 2023-04-15 NOTE — Assessment & Plan Note (Signed)
Chronic, remains elevated Goal of 119/79 Encouraged to check at home to evaluate possibility of white coat HTN Repeat labs recommended Remains on toprol 25 and hydrochlorothiazide 25 mg Recommend start of ace inhibitor- lisinopril 20

## 2023-04-15 NOTE — Assessment & Plan Note (Signed)
Chronic, variable Failed multiple meds Declines concerns; however, pt appears anxious when in office

## 2023-04-15 NOTE — Assessment & Plan Note (Signed)
Chronic, worsening Trial of prilosec 20 Encouraged to increase to 40 mg at bedtime and then 40 mg BID if uncontrolled Can add in pepcid 20 mg to assist with flares or tums otc Reach back out as needed Due for colon f/u; can discuss further with GI

## 2023-04-15 NOTE — Progress Notes (Signed)
Established patient visit   Patient: Tami Finley   DOB: 12/16/1965   57 y.o. Female  MRN: [id number] Visit Date: 04/15/2023  Today's healthcare provider: Jacky Kindle, FNP  Re Introduced to nurse practitioner role and practice setting.  All questions answered.  Discussed provider/patient relationship and expectations.   Chief Complaint  Patient presents with   Follow-up     F/u for medication refill Indigestion problem w/ omeprazole daily Joint pain   Subjective    HPI HPI     Follow-up    Additional comments:  F/u for medication refill Indigestion problem w/ omeprazole daily Joint pain      Last edited by Shelly Bombard, CMA on 04/15/2023  1:10 PM.      Medications: Outpatient Medications Prior to Visit  Medication Sig   Ascorbic Acid (VITAMIN C) 100 MG tablet Take 100 mg by mouth daily.   fluticasone (FLONASE) 50 MCG/ACT nasal spray    hydrochlorothiazide (HYDRODIURIL) 25 MG tablet TAKE 1 TABLET (25 MG TOTAL) BY MOUTH DAILY.   levothyroxine (SYNTHROID) 50 MCG tablet TAKE 1 TABLET BY MOUTH EVERY DAY   metoprolol succinate (TOPROL-XL) 25 MG 24 hr tablet TAKE 1 TABLET (25 MG TOTAL) BY MOUTH DAILY.   Multiple Vitamins-Minerals (ZINC PO) Take by mouth.   No facility-administered medications prior to visit.    Review of Systems Last CBC Lab Results  Component Value Date   WBC 5.9 07/22/2022   HGB 11.5 07/22/2022   HCT 34.9 07/22/2022   MCV 89 07/22/2022   MCH 29.3 07/22/2022   RDW 13.2 07/22/2022   PLT 345 07/22/2022   Last metabolic panel Lab Results  Component Value Date   GLUCOSE 105 (H) 07/22/2022   NA 141 07/22/2022   K 4.0 07/22/2022   CL 101 07/22/2022   CO2 23 07/22/2022   BUN 15 07/22/2022   CREATININE 0.71 07/22/2022   EGFR 100 07/22/2022   CALCIUM 10.0 07/22/2022   PROT 7.0 07/22/2022   ALBUMIN 4.7 07/22/2022   LABGLOB 2.3 07/22/2022   AGRATIO 2.0 07/22/2022   BILITOT <0.2 07/22/2022   ALKPHOS 136 (H) 07/22/2022   AST 20  07/22/2022   ALT 20 07/22/2022   Last lipids Lab Results  Component Value Date   CHOL 239 (H) 07/22/2022   HDL 74 07/22/2022   LDLCALC 128 (H) 07/22/2022   TRIG 210 (H) 07/22/2022   CHOLHDL 3.2 07/22/2022   Last hemoglobin A1c Lab Results  Component Value Date   HGBA1C 5.7 (H) 07/22/2022   Last thyroid functions Lab Results  Component Value Date   TSH 0.917 07/22/2022   T4TOTAL 7.7 10/27/2020        Objective    BP (!) 145/79 (BP Location: Left Arm, Patient Position: Sitting, Cuff Size: Normal)   Pulse 89   Ht 5\' 5"  (1.651 m)   Wt 165 lb 8 oz (75.1 kg)   LMP 07/23/2003 Comment: IUD.  LMP 12 yrs ago.  SpO2 98%   BMI 27.54 kg/m  BP Readings from Last 3 Encounters:  04/15/23 (!) 145/79  09/24/22 (!) 155/96  07/22/22 133/75   Wt Readings from Last 3 Encounters:  04/15/23 165 lb 8 oz (75.1 kg)  07/22/22 159 lb (72.1 kg)  03/13/22 149 lb (67.6 kg)   SpO2 Readings from Last 3 Encounters:  04/15/23 98%  07/22/22 100%  03/13/22 99%   Physical Exam Vitals and nursing note reviewed.  Constitutional:      General:  She is not in acute distress.    Appearance: Normal appearance. She is overweight. She is not ill-appearing, toxic-appearing or diaphoretic.  HENT:     Head: Normocephalic and atraumatic.  Cardiovascular:     Rate and Rhythm: Normal rate and regular rhythm.     Pulses: Normal pulses.     Heart sounds: Normal heart sounds. No murmur heard.    No friction rub. No gallop.  Pulmonary:     Effort: Pulmonary effort is normal. No respiratory distress.     Breath sounds: Normal breath sounds. No stridor. No wheezing, rhonchi or rales.  Chest:     Chest wall: No tenderness.  Abdominal:     General: Bowel sounds are normal.  Musculoskeletal:        General: No swelling, tenderness, deformity or signs of injury. Normal range of motion.     Right lower leg: No edema.     Left lower leg: No edema.     Comments: Complaints of primary joint pain; worse upon  awakening and end of day Previously tx'd by rheum   Skin:    General: Skin is warm and dry.     Capillary Refill: Capillary refill takes less than 2 seconds.     Coloration: Skin is not jaundiced or pale.     Findings: No bruising, erythema, lesion or rash.  Neurological:     General: No focal deficit present.     Mental Status: She is alert and oriented to person, place, and time. Mental status is at baseline.     Cranial Nerves: No cranial nerve deficit.     Sensory: No sensory deficit.     Motor: No weakness.     Coordination: Coordination normal.  Psychiatric:        Mood and Affect: Mood normal.        Behavior: Behavior normal.        Thought Content: Thought content normal.        Judgment: Judgment normal.     No results found for any visits on 04/15/23.  Assessment & Plan     Problem List Items Addressed This Visit       Cardiovascular and Mediastinum   Hypertension - Primary    Chronic, remains elevated Goal of 119/79 Encouraged to check at home to evaluate possibility of white coat HTN Repeat labs recommended Remains on toprol 25 and hydrochlorothiazide 25 mg Recommend start of ace inhibitor- lisinopril 20       Relevant Orders   CBC with Differential/Platelet   Comprehensive Metabolic Panel (CMET)   TSH     Digestive   Gastroesophageal reflux disease with esophagitis without hemorrhage    Chronic, worsening Trial of prilosec 20 Encouraged to increase to 40 mg at bedtime and then 40 mg BID if uncontrolled Can add in pepcid 20 mg to assist with flares or tums otc Reach back out as needed Due for colon f/u; can discuss further with GI         Musculoskeletal and Integument   Psoriatic arthritis of multiple joints (HCC)    Acute on chronic, untreated Repeat labs Trial of cymbalta to assist 6 week f/u recommended       Relevant Orders   Sed Rate (ESR)   C-reactive protein   Rheumatoid Factor   ANA Direct w/Reflex if Positive     Other    Avitaminosis D    Chronic, previously overcorrected then stable Repeat labs to evaluate supplementation needs  Relevant Orders   Vitamin D (25 hydroxy)   GAD (generalized anxiety disorder)    Chronic, variable Failed multiple meds Declines concerns; however, pt appears anxious when in office      Relevant Medications   DULoxetine (CYMBALTA) 30 MG capsule   Hyperlipidemia    Chronic, repeat LP The 10-year ASCVD risk score (Arnett DK, et al., 2019) is: 3.1% recommend diet low in saturated fat and regular exercise - 30 min at least 5 times per week       Relevant Orders   Lipid panel   Lipoprotein A (LPA)   Prediabetes    Chronic, repeat A1c Continue to recommend balanced, lower carb meals. Smaller meal size, adding snacks. Choosing water as drink of choice and increasing purposeful exercise. Controlled with lifestyle       Relevant Orders   Hemoglobin A1c   Other Visit Diagnoses     Colon cancer screening       Relevant Orders   Ambulatory referral to Gastroenterology      Return in about 6 weeks (around 05/27/2023) for chonic disease management.     Leilani Merl, FNP, have reviewed all documentation for this visit. The documentation on 04/15/23 for the exam, diagnosis, procedures, and orders are all accurate and complete.  Jacky Kindle, FNP  [facility] [phone] (phone) [phone] (fax)  [facility]

## 2023-04-15 NOTE — Assessment & Plan Note (Signed)
Chronic, repeat LP The 10-year ASCVD risk score (Arnett DK, et al., 2019) is: 3.1% recommend diet low in saturated fat and regular exercise - 30 min at least 5 times per week

## 2023-04-15 NOTE — Assessment & Plan Note (Signed)
Chronic, previously overcorrected then stable Repeat labs to evaluate supplementation needs

## 2023-04-15 NOTE — Assessment & Plan Note (Signed)
Chronic, repeat A1c Continue to recommend balanced, lower carb meals. Smaller meal size, adding snacks. Choosing water as drink of choice and increasing purposeful exercise. Controlled with lifestyle

## 2023-04-16 ENCOUNTER — Other Ambulatory Visit: Payer: Self-pay

## 2023-04-16 ENCOUNTER — Other Ambulatory Visit: Payer: Self-pay | Admitting: Family Medicine

## 2023-04-16 DIAGNOSIS — R7303 Prediabetes: Secondary | ICD-10-CM

## 2023-04-16 DIAGNOSIS — E782 Mixed hyperlipidemia: Secondary | ICD-10-CM

## 2023-04-16 DIAGNOSIS — K760 Fatty (change of) liver, not elsewhere classified: Secondary | ICD-10-CM | POA: Insufficient documentation

## 2023-04-16 DIAGNOSIS — I10 Essential (primary) hypertension: Secondary | ICD-10-CM

## 2023-04-16 LAB — COMPREHENSIVE METABOLIC PANEL
ALT: 29 [IU]/L (ref 0–32)
AST: 21 [IU]/L (ref 0–40)
Albumin: 4.6 g/dL (ref 3.8–4.9)
Alkaline Phosphatase: 158 [IU]/L — ABNORMAL HIGH (ref 44–121)
BUN/Creatinine Ratio: 21 (ref 9–23)
BUN: 16 mg/dL (ref 6–24)
Bilirubin Total: <0.2 mg/dL (ref 0.0–1.2)
CO2: 21 mmol/L (ref 20–29)
Calcium: 9.8 mg/dL (ref 8.7–10.2)
Chloride: 101 mmol/L (ref 96–106)
Creatinine, Ser: 0.78 mg/dL (ref 0.57–1.00)
Globulin, Total: 2.3 g/dL (ref 1.5–4.5)
Glucose: 98 mg/dL (ref 70–99)
Potassium: 4 mmol/L (ref 3.5–5.2)
Sodium: 141 mmol/L (ref 134–144)
Total Protein: 6.9 g/dL (ref 6.0–8.5)
eGFR: 90 mL/min/{1.73_m2} (ref >59)

## 2023-04-16 LAB — HEMOGLOBIN A1C
Est. average glucose Bld gHb Est-mCnc: 123 mg/dL
Hgb A1c MFr Bld: 5.9 % — ABNORMAL HIGH (ref 4.8–5.6)

## 2023-04-16 LAB — CBC WITH DIFFERENTIAL/PLATELET
Basophils Absolute: 0 10*3/uL (ref 0.0–0.2)
Basos: 1 %
EOS (ABSOLUTE): 0.1 10*3/uL (ref 0.0–0.4)
Eos: 1 %
Hematocrit: 40.4 % (ref 34.0–46.6)
Hemoglobin: 13.1 g/dL (ref 11.1–15.9)
Immature Grans (Abs): 0 10*3/uL (ref 0.0–0.1)
Immature Granulocytes: 0 %
Lymphocytes Absolute: 2.7 10*3/uL (ref 0.7–3.1)
Lymphs: 41 %
MCH: 29 pg (ref 26.6–33.0)
MCHC: 32.4 g/dL (ref 31.5–35.7)
MCV: 90 fL (ref 79–97)
Monocytes Absolute: 0.4 10*3/uL (ref 0.1–0.9)
Monocytes: 7 %
Neutrophils Absolute: 3.4 10*3/uL (ref 1.4–7.0)
Neutrophils: 50 %
Platelets: 341 10*3/uL (ref 150–450)
RBC: 4.51 x10E6/uL (ref 3.77–5.28)
RDW: 14.2 % (ref 11.7–15.4)
WBC: 6.6 10*3/uL (ref 3.4–10.8)

## 2023-04-16 LAB — C-REACTIVE PROTEIN: CRP: 6 mg/L (ref 0–10)

## 2023-04-16 LAB — SEDIMENTATION RATE: Sed Rate: 27 mm/h (ref 0–40)

## 2023-04-16 LAB — LIPID PANEL
Chol/HDL Ratio: 3.3 {ratio} (ref 0.0–4.4)
Cholesterol, Total: 225 mg/dL — ABNORMAL HIGH (ref 100–199)
HDL: 69 mg/dL (ref >39)
LDL Chol Calc (NIH): 127 mg/dL — ABNORMAL HIGH (ref 0–99)
Triglycerides: 165 mg/dL — ABNORMAL HIGH (ref 0–149)
VLDL Cholesterol Cal: 29 mg/dL (ref 5–40)

## 2023-04-16 LAB — VITAMIN D 25 HYDROXY (VIT D DEFICIENCY, FRACTURES): Vit D, 25-Hydroxy: 51.1 ng/mL (ref 30.0–100.0)

## 2023-04-16 LAB — ANA W/REFLEX IF POSITIVE: Anti Nuclear Antibody (ANA): NEGATIVE

## 2023-04-16 LAB — RHEUMATOID FACTOR: Rheumatoid fact SerPl-aCnc: <10 [IU]/mL (ref <14.0)

## 2023-04-16 LAB — TSH: TSH: 0.866 u[IU]/mL (ref 0.450–4.500)

## 2023-04-16 LAB — LIPOPROTEIN A (LPA): Lipoprotein (a): 17.5 nmol/L (ref <75.0)

## 2023-04-16 MED ORDER — METFORMIN HCL ER 750 MG PO TB24: 750.0000 mg | ORAL_TABLET | Freq: Every day | ORAL | 3 refills | Status: DC

## 2023-04-16 MED ORDER — ROSUVASTATIN CALCIUM 10 MG PO TABS
10.0000 mg | ORAL_TABLET | Freq: Every day | ORAL | 3 refills | Status: DC
Start: 2023-04-16 — End: 2023-06-05

## 2023-04-16 MED ORDER — LISINOPRIL 20 MG PO TABS
20.0000 mg | ORAL_TABLET | Freq: Every day | ORAL | 3 refills | Status: DC
Start: 2023-04-16 — End: 2023-12-24

## 2023-04-16 NOTE — Progress Notes (Signed)
Liver enzymes and pre-diabetes has increased; Continue to recommend balanced, lower carb meals. Smaller meal size, adding snacks. Choosing water as drink of choice and increasing purposeful exercise. Recommend start of Metformin XR 750 mg once daily to assist. Recommend start of Lisinopril 20 mg to assist HTN.   Recommend liver US if desired to assist with liver evaluation for fatty liver disease.  Cholesterol remains elevated; The 10-year ASCVD risk score (Arnett DK, et al., 2019) is: 3.1%  Inflammation markers negative at this time. Consider Crestor 10 mg daily to assist

## 2023-04-21 ENCOUNTER — Encounter: Payer: Self-pay | Admitting: Family Medicine

## 2023-04-25 ENCOUNTER — Ambulatory Visit
Admission: RE | Admit: 2023-04-25 | Discharge: 2023-04-25 | Disposition: A | Discharge status: Home / Self Care | Payer: Managed Care, Other (non HMO) | Source: Ambulatory Visit | Attending: Family Medicine | Admitting: Family Medicine

## 2023-04-25 DIAGNOSIS — K760 Fatty (change of) liver, not elsewhere classified: Secondary | ICD-10-CM | POA: Diagnosis present

## 2023-04-28 ENCOUNTER — Encounter: Payer: Self-pay | Admitting: *Deleted

## 2023-05-27 ENCOUNTER — Ambulatory Visit: Payer: Managed Care, Other (non HMO) | Admitting: Family Medicine

## 2023-05-28 ENCOUNTER — Encounter: Payer: Self-pay | Admitting: Family Medicine

## 2023-05-28 ENCOUNTER — Ambulatory Visit (INDEPENDENT_AMBULATORY_CARE_PROVIDER_SITE_OTHER): Payer: Managed Care, Other (non HMO) | Admitting: Family Medicine

## 2023-05-28 VITALS — BP 133/78 | HR 76 | Ht 65.0 in | Wt 160.6 lb

## 2023-05-28 DIAGNOSIS — E782 Mixed hyperlipidemia: Secondary | ICD-10-CM

## 2023-05-28 DIAGNOSIS — I10 Essential (primary) hypertension: Secondary | ICD-10-CM

## 2023-05-28 DIAGNOSIS — R7303 Prediabetes: Secondary | ICD-10-CM

## 2023-05-28 NOTE — Progress Notes (Signed)
 Established Patient Office Visit  Introduced to nurse practitioner role and practice setting.  All questions answered.  Discussed provider/patient relationship and expectations.   Subjective   Patient ID: Tami Finley, female    DOB: Apr 03, 1966  Age: 58 y.o. MRN: [id number]  Chief Complaint  Patient presents with   Medical Management of Chronic Issues    6 week follow-up   Hypertension   Hyperlipidemia    History of hypertension, presents for a follow-up visit after recent medication changes. Reports  blood pressure readings have consistently been high during clinic visits, but she do not monitor their blood pressure at home. Previously on metoprolol  and hydrochlorothiazide , but  were discontinued and replaced with lisinopril  20mg .  The patient also has a diagnosis of fatty liver disease, identified on a recent liver ultrasound. They report making dietary changes, including reducing sodium intake and increasing protein consumption. Despite these changes, they have been diagnosed with prediabetes and have started on metformin .  States lack of regular exercise, citing weather conditions as a barrier. She expresses desire to improve her health for potential future grandchildren and agree to a referral to a nutritionist for further dietary guidance.  HLD- They report taking an omega-3 supplement and making dietary changes.   The 10-year ASCVD risk score (Arnett DK, et al., 2019) is: 2.6%   Values used to calculate the score:     Age: 55 years     Sex: Female     Is Non-Hispanic African American: No     Diabetic: No     Tobacco smoker: No     Systolic Blood Pressure: 133 mmHg     Is BP treated: Yes     HDL Cholesterol: 69 mg/dL     Total Cholesterol: 225 mg/dL         88/73/7975    8:91 PM 07/22/2022    1:06 PM 03/13/2022    8:35 AM  Depression screen PHQ 2/9  Decreased Interest 0 0 0  Down, Depressed, Hopeless 0 0 0  PHQ - 2 Score 0 0 0  Altered sleeping 0 0 1  Tired,  decreased energy 0 1 1  Change in appetite 0 0 0  Feeling bad or failure about yourself  0 0 0  Trouble concentrating 0 0 0  Moving slowly or fidgety/restless 0 0 0  Suicidal thoughts 0 0 0  PHQ-9 Score 0 1 2  Difficult doing work/chores Not difficult at all Not difficult at all Not difficult at all       04/15/2023    1:08 PM 03/26/2021    3:30 PM 03/24/2020    9:46 AM  GAD 7 : Generalized Anxiety Score  Nervous, Anxious, on Edge 0 0 0  Control/stop worrying 0 0 0  Worry too much - different things 0 0 0  Trouble relaxing 0 0 0  Restless 0 0 0  Easily annoyed or irritable 0 0 0  Afraid - awful might happen 0 0 0  Total GAD 7 Score 0 0 0  Anxiety Difficulty Not difficult at all       Review of Systems  All other systems reviewed and are negative.   Negative unless indicated in HPI   Objective:     BP 133/78 (BP Location: Left Arm, Patient Position: Sitting, Cuff Size: Normal)   Pulse 76   Ht 5' 5 (1.651 m)   Wt 160 lb 9.6 oz (72.8 kg)   LMP 07/23/2003 Comment: IUD.  LMP 12  yrs ago.  BMI 26.73 kg/m    Physical Exam Constitutional:      General: She is not in acute distress.    Appearance: Normal appearance. She is normal weight. She is not ill-appearing, toxic-appearing or diaphoretic.  HENT:     Head: Normocephalic.     Nose: Nose normal. No congestion or rhinorrhea.     Mouth/Throat:     Mouth: Mucous membranes are moist.     Pharynx: Oropharynx is clear.  Eyes:     Extraocular Movements: Extraocular movements intact.     Pupils: Pupils are equal, round, and reactive to light.  Cardiovascular:     Rate and Rhythm: Normal rate and regular rhythm.     Pulses: Normal pulses.     Heart sounds: Normal heart sounds. No murmur heard.    No friction rub. No gallop.  Pulmonary:     Effort: Pulmonary effort is normal. No respiratory distress.     Breath sounds: Normal breath sounds. No stridor. No wheezing, rhonchi or rales.  Chest:     Chest wall: No  tenderness.  Musculoskeletal:     Right lower leg: No edema.     Left lower leg: No edema.  Skin:    Capillary Refill: Capillary refill takes less than 2 seconds.  Neurological:     General: No focal deficit present.     Mental Status: She is alert and oriented to person, place, and time. Mental status is at baseline.  Psychiatric:        Attention and Perception: Attention and perception normal.        Mood and Affect: Affect normal. Mood is anxious.        Speech: Speech normal.        Behavior: Behavior normal. Behavior is cooperative.        Thought Content: Thought content normal.        Cognition and Memory: Cognition and memory normal.        Judgment: Judgment normal.     Comments: Hyperactive in conversation      No results found for any visits on 05/28/23.  Last metabolic panel Lab Results  Component Value Date   GLUCOSE 98 04/15/2023   NA 141 04/15/2023   K 4.0 04/15/2023   CL 101 04/15/2023   CO2 21 04/15/2023   BUN 16 04/15/2023   CREATININE 0.78 04/15/2023   EGFR 90 04/15/2023   CALCIUM  9.8 04/15/2023   PROT 6.9 04/15/2023   ALBUMIN 4.6 04/15/2023   LABGLOB 2.3 04/15/2023   AGRATIO 2.0 07/22/2022   BILITOT <0.2 04/15/2023   ALKPHOS 158 (H) 04/15/2023   AST 21 04/15/2023   ALT 29 04/15/2023   Last lipids Lab Results  Component Value Date   CHOL 225 (H) 04/15/2023   HDL 69 04/15/2023   LDLCALC 127 (H) 04/15/2023   TRIG 165 (H) 04/15/2023   CHOLHDL 3.3 04/15/2023   Last hemoglobin A1c Lab Results  Component Value Date   HGBA1C 5.9 (H) 04/15/2023     The 10-year ASCVD risk score (Arnett DK, et al., 2019) is: 2.6%    Assessment & Plan:  Primary hypertension Assessment & Plan: Elevated blood pressure readings in clinic at 175/80. Unclear home blood pressure readings due to inconsistent monitoring. Patient was previously on metoprolol  and hydrochlorothiazide , but these were discontinued and replaced with lisinopril  20mg  daily. - Denies  headaches, vision changes, end organ changes - pt appears quite anxious during visit, elevated BP appears more so psych  related/ white coat vs primary htn. Plans:  -Rechecked BP post visit with deep breathing and relaxing 133/78 - will hold on changing dose today - f/u in 2 weeks for BP check -Recommend patient to monitor blood pressure at home with an upper arm cuff. -Consider increasing lisinopril  dose if blood pressure remains elevated at next visit - low sodium diet, exercise, weigh tloss    Prediabetes Assessment & Plan: Last A1C - 5.8 as of 04/15/23 - not due for recheck yet. -Continue metformin  750 mg daily po  - small frequent meals - Diet and exercise -Refer to nutritionist for diet plan.  Orders: -     Referral to Nutrition and Diabetes Services  Mixed hyperlipidemia Assessment & Plan: Patient has slightly improved lipid panel compared to 10 months ago. - US  liver - fatty liver disease - decrease saturated fats, increase nuts, fish, yogurts, legumes - daily omega 3 -Continue current diet and exercise regimen. -Recheck lipid panel in 2 months.  The 10-year ASCVD risk score (Arnett DK, et al., 2019) is: 2.6%   Values used to calculate the score:     Age: 66 years     Sex: Female     Is Non-Hispanic African American: No     Diabetic: No     Tobacco smoker: No     Systolic Blood Pressure: 133 mmHg     Is BP treated: Yes     HDL Cholesterol: 69 mg/dL     Total Cholesterol: 225 mg/dL   Orders: -     Referral to Nutrition and Diabetes Services     Return in about 2 weeks (around 06/11/2023) for BP check.   I, Curtis DELENA Boom, FNP, have reviewed all documentation for this visit. The documentation on 05/28/23 for the exam, diagnosis, procedures, and orders are all accurate and complete.   Curtis DELENA Boom, FNP

## 2023-05-28 NOTE — Assessment & Plan Note (Addendum)
 Patient has slightly improved lipid panel compared to 10 months ago. - US  liver - fatty liver disease - decrease saturated fats, increase nuts, fish, yogurts, legumes - daily omega 3 -Continue current diet and exercise regimen. -Recheck lipid panel in 2 months.  The 10-year ASCVD risk score (Arnett DK, et al., 2019) is: 2.6%   Values used to calculate the score:     Age: 58 years     Sex: Female     Is Non-Hispanic African American: No     Diabetic: No     Tobacco smoker: No     Systolic Blood Pressure: 133 mmHg     Is BP treated: Yes     HDL Cholesterol: 69 mg/dL     Total Cholesterol: 225 mg/dL

## 2023-05-28 NOTE — Assessment & Plan Note (Signed)
 Last A1C - 5.8 as of 04/15/23 - not due for recheck yet. -Continue metformin 750 mg daily po  - small frequent meals - Diet and exercise -Refer to nutritionist for diet plan.

## 2023-05-28 NOTE — Assessment & Plan Note (Signed)
 Elevated blood pressure readings in clinic at 175/80. Unclear home blood pressure readings due to inconsistent monitoring. Patient was previously on metoprolol  and hydrochlorothiazide , but these were discontinued and replaced with lisinopril  20mg  daily. - Denies headaches, vision changes, end organ changes - pt appears quite anxious during visit, elevated BP appears more so psych related/ white coat vs primary htn. Plans:  -Rechecked BP post visit with deep breathing and relaxing 133/78 - will hold on changing dose today - f/u in 2 weeks for BP check -Recommend patient to monitor blood pressure at home with an upper arm cuff. -Consider increasing lisinopril  dose if blood pressure remains elevated at next visit - low sodium diet, exercise, weigh tloss

## 2023-06-05 ENCOUNTER — Ambulatory Visit (INDEPENDENT_AMBULATORY_CARE_PROVIDER_SITE_OTHER): Payer: Managed Care, Other (non HMO) | Admitting: Family Medicine

## 2023-06-05 ENCOUNTER — Encounter: Payer: Self-pay | Admitting: Family Medicine

## 2023-06-05 VITALS — BP 118/68 | HR 72 | Temp 97.8°F | Resp 16 | Ht 65.0 in | Wt 160.0 lb

## 2023-06-05 DIAGNOSIS — R630 Anorexia: Secondary | ICD-10-CM | POA: Diagnosis not present

## 2023-06-05 DIAGNOSIS — R197 Diarrhea, unspecified: Secondary | ICD-10-CM | POA: Diagnosis not present

## 2023-06-05 MED ORDER — ONDANSETRON 4 MG PO TBDP: 4.0000 mg | ORAL_TABLET | Freq: Three times a day (TID) | ORAL | 0 refills | Status: DC | PRN

## 2023-06-05 NOTE — Patient Instructions (Signed)
Any new symptoms with your diarrhea or any worsening then I would recommend blood and stool tests for further evaluation  For now hold metformin for about a week, cut your lisinopril in half to avoid too low of blood pressure, push fluids with electrolytes and some sugars.  Diarrhea, Adult Diarrhea is when you pass loose and sometimes watery poop (stool) often. Diarrhea can make you feel weak and cause you to lose water in your body (get dehydrated). Losing water in your body can cause you to: Feel tired and thirsty. Have a dry mouth. Go pee (urinate) less often. Diarrhea often lasts 2-3 days. It can last longer if it is a sign of something more serious. Be sure to treat your diarrhea as told by your doctor. Follow these instructions at home: Eating and drinking     Follow these instructions as told by your doctor: Take an ORS (oral rehydration solution). This is a drink that helps you replace fluids and minerals your body lost. It is sold at pharmacies and stores. Drink enough fluid to keep your pee (urine) pale yellow. Drink fluids such as: Water. You can also get fluids by sucking on ice chips. Diluted fruit juice. Low-calorie sports drinks. Milk. Avoid drinking fluids that have a lot of sugar or caffeine in them. These include soda, energy drinks, and regular sports drinks. Avoid alcohol. Eat bland, easy-to-digest foods in small amounts as you are able. These foods include: Bananas. Applesauce. Rice. Low-fat (lean) meats. Toast. Crackers. Avoid spicy or fatty foods.  Medicines Take over-the-counter and prescription medicines only as told by your doctor. If you were prescribed antibiotics, take them as told by your doctor. Do not stop taking them even if you start to feel better. General instructions  Wash your hands often using soap and water for 20 seconds. If soap and water are not available, use hand sanitizer. Others in your home should wash their hands as well. Wash  your hands: After using the toilet or changing a diaper. Before preparing, cooking, or serving food. While caring for a sick person. While visiting someone in a hospital. Rest at home while you get better. Take a warm bath to help with any burning or pain from having diarrhea. Watch your condition for any changes. Contact a doctor if: You have a fever. Your diarrhea gets worse. You have new symptoms. You vomit every time you eat or drink. You feel light-headed, dizzy, or you have a headache. You have muscle cramps. You have signs of losing too much water in your body, such as: Dark pee, very little pee, or no pee. Cracked lips. Dry mouth. Sunken eyes. Sleepiness. Weakness. You have bloody or black poop or poop that looks like tar. You have very bad pain, cramping, or bloating in your belly (abdomen). Your skin feels cold and clammy. You feel confused. Get help right away if: You have chest pain. Your heart is beating very quickly. You have trouble breathing or you are breathing very quickly. You feel very weak or you faint. These symptoms may be an emergency. Get help right away. Call 911. Do not wait to see if the symptoms will go away. Do not drive yourself to the hospital. This information is not intended to replace advice given to you by your health care provider. Make sure you discuss any questions you have with your health care provider. Document Revised: 10/23/2021 Document Reviewed: 10/23/2021 Elsevier Patient Education  2024 [employer]   Dehydration, Adult Dehydration is a condition  in which there is not enough water or other fluids in the body. This happens when a person loses more fluids than they take in. Important organs cannot work right without the right amount of fluids. Any loss of fluids from the body can cause dehydration. Dehydration can be mild, worse, or very bad. It should be treated right away to keep it from getting very bad. What are the  causes? Conditions that cause loss of water in the body. They include: Watery poop (diarrhea). Vomiting. Sweating a lot. Fever. Infection. Peeing (urinating) a lot. Not drinking enough fluids. Certain medicines, such as medicines that take extra fluid out of the body (diuretics). Lack of safe drinking water. Not being able to get enough water and food. What increases the risk? Having a long-term (chronic) illness that has not been treated the right way, such as: Diabetes. Heart disease. Kidney disease. Being 76 years of age or older. Having a disability. Living in a place that is high above the ground or sea (high in altitude). The thinner, drier air causes more fluid loss. Doing exercises that put stress on your body for a long time. Being active when in hot places. What are the signs or symptoms? Symptoms of dehydration depend on how bad it is. Mild or worse dehydration Thirst. Dry lips or dry mouth. Feeling dizzy or light-headed. Muscle cramps. Passing little pee or dark pee. Pee may be the color of tea. Headache. Very bad dehydration Changes in skin. Skin may: Be cold to the touch (clammy). Be blotchy or pale. Not go back to normal right after you pinch it and let it go. Little or no tears, pee, or sweat. Fast breathing. Low blood pressure. Weak pulse. Pulse that is more than 100 beats a minute when you are sitting still. Other changes, such as: Feeling very thirsty. Eyes that look hollow (sunken). Cold hands and feet. Being confused. Being very tired (lethargic) or having trouble waking from sleep. Losing weight. Loss of consciousness. How is this treated? Treatment for this condition depends on how bad your dehydration is. Treatment should start right away. Do not wait until your condition gets very bad. Very bad dehydration is an emergency. You will need to go to a hospital. Mild or worse dehydration can be treated at home. You may be asked to: Drink more  fluids. Drink an oral rehydration solution (ORS). This drink gives you the right amount of fluids, salts, and minerals (electrolytes). Very bad dehydration can be treated: With fluids through an IV tube. By correcting low levels of electrolytes in the body. By treating the problem that caused your dehydration. Follow these instructions at home: Oral rehydration solution If told by your doctor, drink an ORS: Make an ORS. Use instructions on the package. Start by drinking small amounts, about  cup (120 mL) every 5-10 minutes. Slowly drink more until you have had the amount that your doctor said to have.  Eating and drinking  Drink enough clear fluid to keep your pee pale yellow. If you were told to drink an ORS, finish the ORS first. Then, start slowly drinking other clear fluids. Drink fluids such as: Water. Do not drink only water. Doing that can make the salt (sodium) level in your body get too low. Water from ice chips you suck on. Fruit juice that you have added water to (diluted). Low-calorie sports drinks. Eat foods that have the right amounts of salts and minerals, such as bananas, oranges, potatoes, tomatoes, or spinach. Do not  drink alcohol. Avoid drinks that have caffeine or sugar. These include:: High-calorie sports drinks. Fruit juice that you did not add water to. Soda. Coffee or energy drinks. Avoid foods that are greasy or have a lot of fat or sugar. General instructions Take over-the-counter and prescription medicines only as told by your doctor. Do not take sodium tablets. Doing that can make the salt level in your body get too high. Return to your normal activities as told by your doctor. Ask your doctor what activities are safe for you. Keep all follow-up visits. Your doctor may check and change your treatment. Contact a doctor if: You have pain in your belly (abdomen) and the pain: Gets worse. Stays in one place. You have a rash. You have a stiff neck. You  get angry or annoyed more easily than normal. You are more tired or have a harder time waking than normal. You feel weak or dizzy. You feel very thirsty. Get help right away if: You have any symptoms of very bad dehydration. You vomit every time you eat or drink. Your vomiting gets worse, does not go away, or you vomit blood or green stuff. You are getting treatment, but symptoms are getting worse. You have a fever. You have a very bad headache. You have: Diarrhea that gets worse or does not go away. Blood in your poop (stool). This may cause poop to look black and tarry. No pee in 6-8 hours. Only a small amount of pee in 6-8 hours, and the pee is very dark. You have trouble breathing. These symptoms may be an emergency. Get help right away. Call 911. Do not wait to see if the symptoms will go away. Do not drive yourself to the hospital. This information is not intended to replace advice given to you by your health care provider. Make sure you discuss any questions you have with your health care provider. Document Revised: 12/03/2021 Document Reviewed: 12/03/2021 Elsevier Patient Education  2024 [employer].

## 2023-06-05 NOTE — Progress Notes (Signed)
Patient ID: KYAIRA MCMEANS, female    DOB: 05/14/1966, 58 y.o.   MRN: [id number]  PCP: Jacky Kindle, FNP (Inactive)  Chief Complaint  Patient presents with   Diarrhea   No Appetite    Subjective:   Tami Finley is a 58 y.o. female, presents to clinic with CC of the following: Pt of BFP Here for dairrhea x 1 week and decreased appetite  Her daughter was home from college and was sick briefly with similar but she got better and pt did not  Started with loose stool and then became watery, brown, no blood, up to 5x at night, incontinence of stool, associated "rumbling in stomach", no pain no cramps.  She denies N fever, chills, sweats, rash  Decreased appetite  BP has been low on lisinopril, some lightheadedness and dry mouth, no syncope BP Readings from Last 6 Encounters:  06/05/23 118/68  05/28/23 133/78  04/15/23 (!) 145/79  09/24/22 (!) 155/96  07/22/22 133/75  03/13/22 129/88       Patient Active Problem List   Diagnosis Date Noted   Metabolic dysfunction-associated steatotic liver disease (MASLD) 04/16/2023   Prediabetes 04/15/2023   GAD (generalized anxiety disorder) 04/15/2023   Gastroesophageal reflux disease with esophagitis without hemorrhage 04/15/2023   Psoriatic arthritis of multiple joints (HCC) 04/15/2023   Avitaminosis D 07/22/2022   Hyperlipidemia 01/10/2022   Hypertension 12/12/2014   Hypothyroidism 11/28/2014      Current Outpatient Medications:    Ascorbic Acid (VITAMIN C) 100 MG tablet, Take 100 mg by mouth daily., Disp: , Rfl:    DULoxetine (CYMBALTA) 30 MG capsule, Take 1 capsule (30 mg total) by mouth daily., Disp: 90 capsule, Rfl: 0   fluticasone (FLONASE) 50 MCG/ACT nasal spray, , Disp: , Rfl:    levothyroxine (SYNTHROID) 50 MCG tablet, TAKE 1 TABLET BY MOUTH EVERY DAY, Disp: 90 tablet, Rfl: 2   lisinopril (ZESTRIL) 20 MG tablet, Take 1 tablet (20 mg total) by mouth daily., Disp: 90 tablet, Rfl: 3   metFORMIN (GLUCOPHAGE-XR) 750 MG 24  hr tablet, Take 1 tablet (750 mg total) by mouth daily with breakfast., Disp: 90 tablet, Rfl: 3   Multiple Vitamins-Minerals (ZINC PO), Take by mouth., Disp: , Rfl:    ondansetron (ZOFRAN-ODT) 4 MG disintegrating tablet, Take 1 tablet (4 mg total) by mouth every 8 (eight) hours as needed for nausea or vomiting., Disp: 10 tablet, Rfl: 0   Allergies  Allergen Reactions   Other     Other reaction(s): Headache, Other Other reaction(s): Headache, Other   Penicillins Rash     Social History   Tobacco Use   Smoking status: Never   Smokeless tobacco: Never  Substance Use Topics   Alcohol use: Not Currently   Drug use: No      Chart Review Today: I personally reviewed active problem list, medication list, allergies, family history, social history, health maintenance, notes from last encounter, lab results, imaging with the patient/caregiver today.   Review of Systems  Constitutional: Negative.   HENT: Negative.    Eyes: Negative.   Respiratory: Negative.    Cardiovascular: Negative.   Gastrointestinal: Negative.   Endocrine: Negative.   Genitourinary: Negative.   Musculoskeletal: Negative.   Skin: Negative.   Allergic/Immunologic: Negative.   Neurological: Negative.   Hematological: Negative.   Psychiatric/Behavioral: Negative.    All other systems reviewed and are negative.      Objective:   Vitals:   06/05/23 1138  BP: 118/68  Pulse: 72  Resp: 16  Temp: 97.8 F (36.6 C)  TempSrc: Oral  SpO2: 97%  Weight: 160 lb (72.6 kg)  Height: 5\' 5"  (1.651 m)    Body mass index is 26.63 kg/m.  Physical Exam Vitals and nursing note reviewed.  Constitutional:      General: She is not in acute distress.    Appearance: Normal appearance. She is well-developed and well-groomed. She is not ill-appearing, toxic-appearing or diaphoretic.     Interventions: Face mask in place.  HENT:     Head: Normocephalic and atraumatic.     Right Ear: External ear normal.     Left Ear:  External ear normal.     Nose: Nose normal.     Mouth/Throat:     Mouth: Mucous membranes are dry.     Pharynx: Uvula midline.  Eyes:     General: Lids are normal. No scleral icterus.    Conjunctiva/sclera: Conjunctivae normal.  Neck:     Trachea: Phonation normal. No tracheal deviation.  Cardiovascular:     Rate and Rhythm: Normal rate and regular rhythm.     Pulses: Normal pulses.          Radial pulses are 2+ on the right side and 2+ on the left side.       Posterior tibial pulses are 2+ on the right side and 2+ on the left side.     Heart sounds: Normal heart sounds. No murmur heard.    No friction rub. No gallop.  Pulmonary:     Effort: Pulmonary effort is normal. No respiratory distress.     Breath sounds: Normal breath sounds. No stridor. No wheezing, rhonchi or rales.  Chest:     Chest wall: No tenderness.  Abdominal:     General: Bowel sounds are normal. There is no distension.     Palpations: Abdomen is soft.     Tenderness: There is no abdominal tenderness. There is no right CVA tenderness, left CVA tenderness, guarding or rebound.  Musculoskeletal:        General: No deformity.     Cervical back: Normal range of motion.  Skin:    General: Skin is warm and dry.     Capillary Refill: Capillary refill takes less than 2 seconds.     Coloration: Skin is not pale.     Findings: No rash.  Neurological:     Mental Status: She is alert. Mental status is at baseline.     Motor: No abnormal muscle tone.     Gait: Gait normal.  Psychiatric:        Mood and Affect: Mood normal.        Speech: Speech normal.        Behavior: Behavior normal. Behavior is cooperative.      Results for orders placed or performed in visit on 04/15/23  Sed Rate (ESR)   Collection Time: 04/15/23  1:35 PM  Result Value Ref Range   Sed Rate 27 0 - 40 mm/hr  C-reactive protein   Collection Time: 04/15/23  1:35 PM  Result Value Ref Range   CRP 6 0 - 10 mg/L  Rheumatoid Factor   Collection  Time: 04/15/23  1:35 PM  Result Value Ref Range   Rheumatoid fact SerPl-aCnc <10.0 <14.0 IU/mL  CBC with Differential/Platelet   Collection Time: 04/15/23  1:35 PM  Result Value Ref Range   WBC 6.6 3.4 - 10.8 x10E3/uL   RBC 4.51 3.77 - 5.28 x10E6/uL  Hemoglobin 13.1 11.1 - 15.9 g/dL   Hematocrit 40.9 81.1 - 46.6 %   MCV 90 79 - 97 fL   MCH 29.0 26.6 - 33.0 pg   MCHC 32.4 31.5 - 35.7 g/dL   RDW 91.4 78.2 - 95.6 %   Platelets 341 150 - 450 x10E3/uL   Neutrophils 50 Not Estab. %   Lymphs 41 Not Estab. %   Monocytes 7 Not Estab. %   Eos 1 Not Estab. %   Basos 1 Not Estab. %   Neutrophils Absolute 3.4 1.4 - 7.0 x10E3/uL   Lymphocytes Absolute 2.7 0.7 - 3.1 x10E3/uL   Monocytes Absolute 0.4 0.1 - 0.9 x10E3/uL   EOS (ABSOLUTE) 0.1 0.0 - 0.4 x10E3/uL   Basophils Absolute 0.0 0.0 - 0.2 x10E3/uL   Immature Granulocytes 0 Not Estab. %   Immature Grans (Abs) 0.0 0.0 - 0.1 x10E3/uL  Comprehensive Metabolic Panel (CMET)   Collection Time: 04/15/23  1:35 PM  Result Value Ref Range   Glucose 98 70 - 99 mg/dL   BUN 16 6 - 24 mg/dL   Creatinine, Ser 2.13 0.57 - 1.00 mg/dL   eGFR 90 >08 MV/HQI/6.96   BUN/Creatinine Ratio 21 9 - 23   Sodium 141 134 - 144 mmol/L   Potassium 4.0 3.5 - 5.2 mmol/L   Chloride 101 96 - 106 mmol/L   CO2 21 20 - 29 mmol/L   Calcium 9.8 8.7 - 10.2 mg/dL   Total Protein 6.9 6.0 - 8.5 g/dL   Albumin 4.6 3.8 - 4.9 g/dL   Globulin, Total 2.3 1.5 - 4.5 g/dL   Bilirubin Total <2.9 0.0 - 1.2 mg/dL   Alkaline Phosphatase 158 (H) 44 - 121 IU/L   AST 21 0 - 40 IU/L   ALT 29 0 - 32 IU/L  TSH   Collection Time: 04/15/23  1:35 PM  Result Value Ref Range   TSH 0.866 0.450 - 4.500 uIU/mL  Hemoglobin A1c   Collection Time: 04/15/23  1:35 PM  Result Value Ref Range   Hgb A1c MFr Bld 5.9 (H) 4.8 - 5.6 %   Est. average glucose Bld gHb Est-mCnc 123 mg/dL  Vitamin D (25 hydroxy)   Collection Time: 04/15/23  1:35 PM  Result Value Ref Range   Vit D, 25-Hydroxy 51.1 30.0 -  100.0 ng/mL  Lipid panel   Collection Time: 04/15/23  1:35 PM  Result Value Ref Range   Cholesterol, Total 225 (H) 100 - 199 mg/dL   Triglycerides 528 (H) 0 - 149 mg/dL   HDL 69 >41 mg/dL   VLDL Cholesterol Cal 29 5 - 40 mg/dL   LDL Chol Calc (NIH) 324 (H) 0 - 99 mg/dL   Chol/HDL Ratio 3.3 0.0 - 4.4 ratio  ANA Direct w/Reflex if Positive   Collection Time: 04/15/23  1:35 PM  Result Value Ref Range   Anti Nuclear Antibody (ANA) Negative Negative  Lipoprotein A (LPA)   Collection Time: 04/15/23  1:35 PM  Result Value Ref Range   Lipoprotein (a) 17.5 <75.0 nmol/L       Assessment & Plan:   1. Diarrhea, unspecified type (Primary) Discussed viral etiologies, diet and hydration conservative management She can continue to use some immodium Zofran may help settle and slow stomach/bowels Hold metformin for 1 week With lower BP take 1/2 dose of lisinopril until she is losing less fluids through bowels and eating more normally  Pt well appearing, VSS, abd exam benign.  We discussed options for labs  and stool tests, declined at this time - they may be indicated if sx persist or worsen Likely viral gastroenteritis with home sick contacts Should be self limiting, focus on hydration and replacement of electrolytes  - ondansetron (ZOFRAN-ODT) 4 MG disintegrating tablet; Take 1 tablet (4 mg total) by mouth every 8 (eight) hours as needed for nausea or vomiting.  Dispense: 10 tablet; Refill: 0  2. Decreased appetite - ondansetron (ZOFRAN-ODT) 4 MG disintegrating tablet; Take 1 tablet (4 mg total) by mouth every 8 (eight) hours as needed for nausea or vomiting.  Dispense: 10 tablet; Refill: 0   Any new symptoms with your diarrhea or any worsening then I would recommend blood and stool tests for further evaluation    Danelle Berry, PA-C 06/05/23 12:18 PM

## 2023-06-06 ENCOUNTER — Encounter: Payer: Self-pay | Admitting: Family Medicine

## 2023-06-11 ENCOUNTER — Encounter: Payer: Self-pay | Admitting: Family Medicine

## 2023-06-11 ENCOUNTER — Ambulatory Visit (INDEPENDENT_AMBULATORY_CARE_PROVIDER_SITE_OTHER): Payer: Managed Care, Other (non HMO) | Admitting: Family Medicine

## 2023-06-11 VITALS — BP 138/68 | HR 78 | Ht 65.0 in | Wt 159.5 lb

## 2023-06-11 DIAGNOSIS — R7303 Prediabetes: Secondary | ICD-10-CM

## 2023-06-11 DIAGNOSIS — E782 Mixed hyperlipidemia: Secondary | ICD-10-CM | POA: Diagnosis not present

## 2023-06-11 DIAGNOSIS — I10 Essential (primary) hypertension: Secondary | ICD-10-CM | POA: Diagnosis not present

## 2023-06-11 NOTE — Progress Notes (Signed)
Established Patient Office Visit  Introduced to nurse practitioner role and practice setting.  All questions answered.  Discussed provider/patient relationship and expectations.   Subjective   Patient ID: Tami Finley, female    DOB: 25-Mar-1966  Age: 58 y.o. MRN: [id number]  Chief Complaint  Patient presents with   Medical Management of Chronic Issues    Hypertension follow-up. Advised to take 1/2 tablet due to having diarrhea, Last time having diarrhea was Monday   Hypertension    Pt reports low reading up until Monday ranging from 103-121/5368. She did not take Monday and it was 133/63 on 1/21 and 133/78 today at 11:10 am. Patient began taking 1/2 tablet the night of the 18th    The patient, a 58 year old individual with a history of hypertension, prediabetes and HLD, presented for a two-week follow-up visit primarily for a blood pressure check.   She recently experienced a bout of diarrhea, which led to a temporary reduction in her Lisinopril dosage from 20mg  to 10mg  and a hold on her Metformin for a week until 06/13/23. The patient reported that the diarrhea has since resolved - not abdominal cramping or pain. Stool formed again  The patient's blood pressure at the visit was 138/68. She reported a transient episode of blurry vision in the left eye, lasting approximately half an hour a couple days ago, with no associated pain. Resolved no issues since.The patient denied any other headaches or vision changes. BP at home ranges from 100s/60s to 120s/70s - appearing to be well controlled on Lisinopril 20mg .  The patient expressed a desire to discontinue Metformin if possible once A1C rechecked.        04/15/2023    1:08 PM 07/22/2022    1:06 PM 03/13/2022    8:35 AM  Depression screen PHQ 2/9  Decreased Interest 0 0 0  Down, Depressed, Hopeless 0 0 0  PHQ - 2 Score 0 0 0  Altered sleeping 0 0 1  Tired, decreased energy 0 1 1  Change in appetite 0 0 0  Feeling bad or failure about  yourself  0 0 0  Trouble concentrating 0 0 0  Moving slowly or fidgety/restless 0 0 0  Suicidal thoughts 0 0 0  PHQ-9 Score 0 1 2  Difficult doing work/chores Not difficult at all Not difficult at all Not difficult at all       04/15/2023    1:08 PM 03/26/2021    3:30 PM 03/24/2020    9:46 AM  GAD 7 : Generalized Anxiety Score  Nervous, Anxious, on Edge 0 0 0  Control/stop worrying 0 0 0  Worry too much - different things 0 0 0  Trouble relaxing 0 0 0  Restless 0 0 0  Easily annoyed or irritable 0 0 0  Afraid - awful might happen 0 0 0  Total GAD 7 Score 0 0 0  Anxiety Difficulty Not difficult at all       Review of Systems  All other systems reviewed and are negative.   Negative unless indicated in HPI   Objective:     BP 138/68 (BP Location: Left Arm, Patient Position: Sitting, Cuff Size: Normal)   Pulse 78   Ht 5\' 5"  (1.651 m)   Wt 159 lb 8 oz (72.3 kg)   LMP 07/23/2003 Comment: IUD.  LMP 12 yrs ago.  SpO2 99%   BMI 26.54 kg/m    Physical Exam Constitutional:      General: She is  not in acute distress.    Appearance: Normal appearance. She is obese. She is not toxic-appearing or diaphoretic.  HENT:     Head: Normocephalic.     Nose: Nose normal.     Mouth/Throat:     Mouth: Mucous membranes are moist.     Pharynx: Oropharynx is clear.  Eyes:     Extraocular Movements: Extraocular movements intact.     Pupils: Pupils are equal, round, and reactive to light.  Cardiovascular:     Rate and Rhythm: Normal rate and regular rhythm.     Pulses: Normal pulses.     Heart sounds: Normal heart sounds. No murmur heard.    No friction rub. No gallop.  Pulmonary:     Effort: No respiratory distress.     Breath sounds: No stridor. No wheezing, rhonchi or rales.  Chest:     Chest wall: No tenderness.  Musculoskeletal:     Right lower leg: No edema.     Left lower leg: No edema.  Skin:    General: Skin is warm and dry.     Capillary Refill: Capillary refill  takes less than 2 seconds.  Neurological:     General: No focal deficit present.     Mental Status: She is alert and oriented to person, place, and time. Mental status is at baseline.  Psychiatric:        Mood and Affect: Mood normal.        Behavior: Behavior normal.        Thought Content: Thought content normal.        Judgment: Judgment normal.    No results found for any visits on 06/11/23.    The 10-year ASCVD risk score (Arnett DK, et al., 2019) is: 2.8%    Assessment & Plan:  Prediabetes Assessment & Plan: Patient expressed interest in potentially discontinuing Metformin if A1C if normalized. Last one was 5.9; 04/15/23 Continue 750mg  metformin daily started Friday, 06/13/23 post acute viral diarrhea episode. - Continue small frequent meals, decrease starches, exercise and weight loss. -Plan to recheck A1C in 6 weeks (early March) and will reassess need for metformin  Orders: -     Hemoglobin A1c; Future  Primary hypertension Assessment & Plan: Chronic, controlled Goal SP<130; DBP<80 Recent blurred vision in L eye for 30 minutes, no headaches or eye pain. Self resolved - wears readers, hx of lasix. No symptom concern today - if repeats or worsens - plan to reeval. Blood pressure slightly elevated today (138/68), likely due to recent halving of Lisinopril dose to 10mg  due to diarrhea and lower BP readings at home Home readings = SBP 100-120s; DBP 60s-70 = controlled -Resume full dose of Lisinopril 20mg  daily starting tomorrow, 06/12/23. 6 week f/u for prediabetes and lipids Will recheck CMP then as well.  Orders: -     Comprehensive metabolic panel; Future  Mixed hyperlipidemia Assessment & Plan: Lifestyle managed. - US liver - fatty liver disease - decrease saturated fats, increase nuts, fish, yogurts, legumes - daily omega 3 -Continue current diet and exercise regimen. -Recheck lipid panel in  6 weeks    Orders: -     Lipid panel; Future  Acute diarrhea  resolved from viral GI infection.   Return in about 6 weeks (around 07/23/2023).   I, Sallee Provencal, FNP, have reviewed all documentation for this visit. The documentation on 06/11/23 for the exam, diagnosis, procedures, and orders are all accurate and complete.   Sallee Provencal, FNP

## 2023-06-11 NOTE — Assessment & Plan Note (Addendum)
Patient expressed interest in potentially discontinuing Metformin if A1C if normalized. Last one was 5.9; 04/15/23 Continue 750mg  metformin daily starting Friday, 06/13/23 post acute viral diarrhea episode. - Continue small frequent meals, decrease starches, exercise and weight loss. -Plan to recheck A1C in 6 weeks (early March) and will reassess need for metformin

## 2023-06-11 NOTE — Assessment & Plan Note (Addendum)
Chronic, controlled Goal SP<130; DBP<80 Recent blurred vision in L eye for 30 minutes, no headaches or eye pain. Self resolved - wears readers, hx of lasix. No symptom concern today - if repeats or worsens - plan to reeval. Blood pressure slightly elevated today (138/68), likely due to recent halving of Lisinopril dose to 10mg  due to diarrhea and lower BP readings at home Home readings = SBP 100-120s; DBP 60s-70 = controlled -Resume full dose of Lisinopril 20mg  daily starting tomorrow, 06/12/23. 6 week f/u for prediabetes and lipids Will recheck CMP then as well.

## 2023-06-11 NOTE — Assessment & Plan Note (Signed)
Lifestyle managed. - US liver - fatty liver disease - decrease saturated fats, increase nuts, fish, yogurts, legumes - daily omega 3 -Continue current diet and exercise regimen. -Recheck lipid panel in  6 weeks

## 2023-07-07 ENCOUNTER — Telehealth: Payer: Self-pay | Admitting: Family Medicine

## 2023-07-07 NOTE — Telephone Encounter (Signed)
[facility] request for the following medications:   metoprolol succinate (TOPROL-XL) 25 MG 24 hr tablet    Please advise.  

## 2023-07-09 NOTE — Telephone Encounter (Signed)
 Medication was discontinued in November 2024 - pt is no longer taking. Will not refill.

## 2023-07-17 ENCOUNTER — Ambulatory Visit: Payer: Managed Care, Other (non HMO) | Admitting: Dietician

## 2023-07-18 ENCOUNTER — Other Ambulatory Visit: Payer: Self-pay

## 2023-07-18 DIAGNOSIS — R7303 Prediabetes: Secondary | ICD-10-CM

## 2023-07-18 DIAGNOSIS — E782 Mixed hyperlipidemia: Secondary | ICD-10-CM

## 2023-07-18 DIAGNOSIS — I10 Essential (primary) hypertension: Secondary | ICD-10-CM

## 2023-07-19 LAB — COMPREHENSIVE METABOLIC PANEL
ALT: 20 IU/L (ref 0–32)
AST: 17 IU/L (ref 0–40)
Albumin: 4.6 g/dL (ref 3.8–4.9)
Alkaline Phosphatase: 168 IU/L — ABNORMAL HIGH (ref 44–121)
BUN/Creatinine Ratio: 23 (ref 9–23)
BUN: 17 mg/dL (ref 6–24)
Bilirubin Total: 0.2 mg/dL (ref 0.0–1.2)
CO2: 23 mmol/L (ref 20–29)
Calcium: 10.1 mg/dL (ref 8.7–10.2)
Chloride: 102 mmol/L (ref 96–106)
Creatinine, Ser: 0.75 mg/dL (ref 0.57–1.00)
Globulin, Total: 2.6 g/dL (ref 1.5–4.5)
Glucose: 87 mg/dL (ref 70–99)
Potassium: 4.5 mmol/L (ref 3.5–5.2)
Sodium: 140 mmol/L (ref 134–144)
Total Protein: 7.2 g/dL (ref 6.0–8.5)
eGFR: 93 mL/min/{1.73_m2} (ref >59)

## 2023-07-19 LAB — LIPID PANEL
Chol/HDL Ratio: 3.4 ratio (ref 0.0–4.4)
Cholesterol, Total: 241 mg/dL — ABNORMAL HIGH (ref 100–199)
HDL: 71 mg/dL (ref >39)
LDL Chol Calc (NIH): 137 mg/dL — ABNORMAL HIGH (ref 0–99)
Triglycerides: 190 mg/dL — ABNORMAL HIGH (ref 0–149)
VLDL Cholesterol Cal: 33 mg/dL (ref 5–40)

## 2023-07-19 LAB — HEMOGLOBIN A1C
Est. average glucose Bld gHb Est-mCnc: 123 mg/dL
Hgb A1c MFr Bld: 5.9 % — ABNORMAL HIGH (ref 4.8–5.6)

## 2023-07-21 ENCOUNTER — Encounter: Payer: Self-pay | Admitting: Family Medicine

## 2023-07-23 ENCOUNTER — Ambulatory Visit (INDEPENDENT_AMBULATORY_CARE_PROVIDER_SITE_OTHER): Payer: Managed Care, Other (non HMO) | Admitting: Family Medicine

## 2023-07-23 ENCOUNTER — Encounter: Payer: Self-pay | Admitting: Family Medicine

## 2023-07-23 VITALS — BP 125/69 | HR 89 | Temp 98.8°F | Ht 65.0 in | Wt 159.0 lb

## 2023-07-23 DIAGNOSIS — E039 Hypothyroidism, unspecified: Secondary | ICD-10-CM

## 2023-07-23 DIAGNOSIS — R7303 Prediabetes: Secondary | ICD-10-CM | POA: Diagnosis not present

## 2023-07-23 DIAGNOSIS — E782 Mixed hyperlipidemia: Secondary | ICD-10-CM

## 2023-07-23 DIAGNOSIS — L4050 Arthropathic psoriasis, unspecified: Secondary | ICD-10-CM

## 2023-07-23 DIAGNOSIS — I10 Essential (primary) hypertension: Secondary | ICD-10-CM | POA: Diagnosis not present

## 2023-07-23 MED ORDER — DULOXETINE HCL 30 MG PO CPEP: ORAL_CAPSULE | ORAL | 1 refills | Status: DC

## 2023-07-23 NOTE — Assessment & Plan Note (Signed)
 Chronic Pt states was non fasting with labs - most likely why triglycerides elevated LDL remains elevated, goal<100 Hx of fatty liver on [place] Lifestyle modifications discussed Referral to dietician placed Exercise and weight loss Water drink of choice Continue omega 3 supplement daily  The 10-year ASCVD risk score (Arnett DK, et al., 2019) is: 2.7%   Values used to calculate the score:     Age: 58 years     Sex: Female     Is Non-Hispanic African American: No     Diabetic: No     Tobacco smoker: No     Systolic Blood Pressure: 125 mmHg     Is BP treated: Yes     HDL Cholesterol: 71 mg/dL     Total Cholesterol: 241 mg/dL

## 2023-07-23 NOTE — Assessment & Plan Note (Signed)
 Chronic, stable Continue Lisinopril 20mg  daily  DASH diet, increase exercise, weightloss, water drink of choice

## 2023-07-23 NOTE — Progress Notes (Signed)
 Established Patient Office Visit  Subjective   Patient ID: Tami Finley, female    DOB: 02-15-1966  Age: 58 y.o. MRN: [id number]  Chief Complaint  Patient presents with   Chronic health management    Patient was last seen about 6 weeks ago.  She had slight elevation of her A1C,  cholesterol and Alk phos.  Patient is concerned about her liver and also is not fond of taking more medication.  She states she is feeling well.     Tami Finley is a 58 year old female with hypertension, prediabetes, elevated liver enzymes, and psoriatic arthritis who presents for medication management and follow-up on lab results.  She is taking lisinopril 20 mg daily for hypertension. Her blood pressure readings at home have been around 120s-130/70s mmHg.  She has a history of elevated liver enzymes and was diagnosed with fatty liver following an ultrasound in December. She was referred to a nutritionist but did not pursue it due to cost. She wants to reverse her condition through lifestyle changes and is interested in a re-referral to a nutritionist.  She has high cholesterol, with her most recent level at 241 mg/dL. She is hesitant to start cholesterol medication and wants to manage it through diet and lifestyle changes. She has been taking omega-3 supplements and is trying to eat healthily, including consuming oatmeal for breakfast.  She has psoriatic arthritis, although recent inflammation markers, including rheumatoid factor, C-reactive protein, and sedimentation rate, were negative. She experiences pain in her sternum, spine, hips, shoulders, and joints, which was previously confirmed by a bone scan. She is taking Cymbalta 30 mg daily for pain management but reports no significant difference in her symptoms. She is considering increasing the dose to see if it helps with pain management.  She has been on thyroid medication for many years, a condition that runs in her family, as both her mother and sister had  thyroid issues. She is also on metformin for blood sugar management, taking it in the morning, and has recently picked up a new prescription. She is concerned about her blood sugar levels despite not consuming excessive sugar and reports no family history of diabetes.  She mentions a past referral for a colonoscopy in November, but she has not been able to schedule it due to communication issues with the office. She also notes that she is due for a mammogram and plans to schedule it soon.        04/15/2023    1:08 PM 07/22/2022    1:06 PM 03/13/2022    8:35 AM  Depression screen PHQ 2/9  Decreased Interest 0 0 0  Down, Depressed, Hopeless 0 0 0  PHQ - 2 Score 0 0 0  Altered sleeping 0 0 1  Tired, decreased energy 0 1 1  Change in appetite 0 0 0  Feeling bad or failure about yourself  0 0 0  Trouble concentrating 0 0 0  Moving slowly or fidgety/restless 0 0 0  Suicidal thoughts 0 0 0  PHQ-9 Score 0 1 2  Difficult doing work/chores Not difficult at all Not difficult at all Not difficult at all       04/15/2023    1:08 PM 03/26/2021    3:30 PM 03/24/2020    9:46 AM  GAD 7 : Generalized Anxiety Score  Nervous, Anxious, on Edge 0 0 0  Control/stop worrying 0 0 0  Worry too much - different things 0 0 0  Trouble  relaxing 0 0 0  Restless 0 0 0  Easily annoyed or irritable 0 0 0  Afraid - awful might happen 0 0 0  Total GAD 7 Score 0 0 0  Anxiety Difficulty Not difficult at all       Review of Systems  All other systems reviewed and are negative.   Negative unless indicated in HPI   Objective:     BP 125/69 (BP Location: Left Arm, Patient Position: Sitting, Cuff Size: Normal)   Pulse 89   Temp 98.8 F (37.1 C) (Oral)   Ht 5\' 5"  (1.651 m)   Wt 159 lb (72.1 kg)   LMP 07/23/2003 Comment: IUD.  LMP 12 yrs ago.  SpO2 100%   BMI 26.46 kg/m    Physical Exam Vitals reviewed.  Constitutional:      General: She is not in acute distress.    Appearance: Normal appearance.  She is overweight. She is not toxic-appearing or diaphoretic.  HENT:     Head: Normocephalic.     Nose: Nose normal.     Mouth/Throat:     Mouth: Mucous membranes are moist.     Pharynx: Oropharynx is clear.  Eyes:     Extraocular Movements: Extraocular movements intact.     Pupils: Pupils are equal, round, and reactive to light.  Neck:     Vascular: No carotid bruit.  Cardiovascular:     Rate and Rhythm: Normal rate and regular rhythm.     Pulses: Normal pulses.     Heart sounds: Normal heart sounds. No murmur heard.    No friction rub. No gallop.  Pulmonary:     Effort: No respiratory distress.     Breath sounds: No stridor. No wheezing, rhonchi or rales.  Chest:     Chest wall: No tenderness.  Musculoskeletal:     Right lower leg: No edema.     Left lower leg: No edema.  Lymphadenopathy:     Cervical: No cervical adenopathy.  Skin:    General: Skin is warm and dry.     Capillary Refill: Capillary refill takes less than 2 seconds.  Neurological:     General: No focal deficit present.     Mental Status: She is alert and oriented to person, place, and time. Mental status is at baseline.  Psychiatric:        Mood and Affect: Mood normal.        Behavior: Behavior normal.        Thought Content: Thought content normal.        Judgment: Judgment normal.     No results found for any visits on 07/23/23.    The 10-year ASCVD risk score (Arnett DK, et al., 2019) is: 2.7%    Assessment & Plan:  Primary hypertension Assessment & Plan: Chronic, stable Continue Lisinopril 20mg  daily  DASH diet, increase exercise, weightloss, water drink of choice   Mixed hyperlipidemia Assessment & Plan: Chronic Pt states was non fasting with labs - most likely why triglycerides elevated LDL remains elevated, goal<100 Hx of fatty liver on [place] Lifestyle modifications discussed Referral to dietician placed Exercise and weight loss Water drink of choice Continue omega 3 supplement  daily  The 10-year ASCVD risk score (Arnett DK, et al., 2019) is: 2.7%   Values used to calculate the score:     Age: 55 years     Sex: Female     Is Non-Hispanic African American: No     Diabetic: No  Tobacco smoker: No     Systolic Blood Pressure: 125 mmHg     Is BP treated: Yes     HDL Cholesterol: 71 mg/dL     Total Cholesterol: 241 mg/dL   Orders: -     Referral to Nutrition and Diabetes Services  Prediabetes Assessment & Plan: Chronic, stable A1C=5.9 Continue metformin 750mg  daily Recheck A1c in 4 months Continue to make conscious decisions for well balanced diet smaller portions with increase protein, fruits, veggies, water as drink of choice, decrease starches, processed foods, and saturated fats. Increase weekly exercise - 150 minutes per week.  Referral to Dietician placed per pt request  Orders: -     Referral to Nutrition and Diabetes Services  Psoriatic arthritis of multiple joints (HCC) Assessment & Plan: Chronic, waxing and waning Previous Inflammation markers normal levels Pt would like to increased Cymbalta to 60 daily to see if pain improves, was previously started at 30mg  daily by previous PCP in November 2024 Discussed can take 4-6 weeks to see improvement Pt will trial to see if increasing dose improves pain. May go back down to 30mg  if no change May take as needed ibuprofen or aleve as well, CMP UTD, BP controlled  Orders: -     DULoxetine HCl; Take two capsules daily for pain mgmt.  Dispense: 180 capsule; Refill: 1  Hypothyroidism, unspecified type Assessment & Plan: Chronic, stable Continue Levothyroxine daily     Return in about 5 months (around 12/23/2023) for chronic disease mgmt.   I, Sallee Provencal, FNP, have reviewed all documentation for this visit. The documentation on 07/23/23 for the exam, diagnosis, procedures, and orders are all accurate and complete.   Sallee Provencal, FNP

## 2023-07-23 NOTE — Assessment & Plan Note (Signed)
 Chronic, stable Continue Levothyroxine daily

## 2023-07-23 NOTE — Assessment & Plan Note (Signed)
 Chronic, waxing and waning Previous Inflammation markers normal levels Pt would like to increased Cymbalta to 60 daily to see if pain improves, was previously started at 30mg  daily by previous PCP in November 2024 Discussed can take 4-6 weeks to see improvement Pt will trial to see if increasing dose improves pain. May go back down to 30mg  if no change May take as needed ibuprofen or aleve as well, CMP UTD, BP controlled

## 2023-07-23 NOTE — Assessment & Plan Note (Signed)
 Chronic, stable A1C=5.9 Continue metformin 750mg  daily Recheck A1c in 4 months Continue to make conscious decisions for well balanced diet smaller portions with increase protein, fruits, veggies, water as drink of choice, decrease starches, processed foods, and saturated fats. Increase weekly exercise - 150 minutes per week.  Referral to Dietician placed per pt request

## 2023-08-12 ENCOUNTER — Telehealth: Payer: Self-pay

## 2023-08-12 ENCOUNTER — Telehealth: Payer: Self-pay | Admitting: *Deleted

## 2023-08-12 ENCOUNTER — Other Ambulatory Visit: Payer: Self-pay | Admitting: *Deleted

## 2023-08-12 ENCOUNTER — Encounter: Payer: Self-pay | Admitting: Family Medicine

## 2023-08-12 DIAGNOSIS — Z1211 Encounter for screening for malignant neoplasm of colon: Secondary | ICD-10-CM

## 2023-08-12 MED ORDER — NA SULFATE-K SULFATE-MG SULF 17.5-3.13-1.6 GM/177ML PO SOLN: 1.0000 | Freq: Once | ORAL | 0 refills | Status: AC

## 2023-08-12 NOTE — Telephone Encounter (Signed)
 Pt requesting call back to schedule colonoscopy.

## 2023-08-12 NOTE — Telephone Encounter (Signed)
 Colonoscopy schedule on 08/19/2023

## 2023-08-12 NOTE — Telephone Encounter (Signed)
 Gastroenterology Pre-Procedure Review  Request Date: 08/19/2023 Requesting Physician: Dr. Servando Snare  PATIENT REVIEW QUESTIONS: The patient responded to the following health history questions as indicated:    1. Are you having any GI issues? no 2. Do you have a personal history of Polyps?  Patient thinks she may have 1 colon polyps about 11-12 years ago 3. Do you have a family history of Colon Cancer or Polyps? no 4. Diabetes Mellitus?  Prediabetes, taking metformin 5. Joint replacements in the past 12 months?no 6. Major health problems in the past 3 months?no 7. Any artificial heart valves, MVP, or defibrillator?no    MEDICATIONS & ALLERGIES:    Patient reports the following regarding taking any anticoagulation/antiplatelet therapy:   Plavix, Coumadin, Eliquis, Xarelto, Lovenox, Pradaxa, Brilinta, or Effient? no Aspirin? no  Patient confirms/reports the following medications:  Current Outpatient Medications  Medication Sig Dispense Refill   Na Sulfate-K Sulfate-Mg Sulfate concentrate (SUPREP) 17.5-3.13-1.6 GM/177ML SOLN Take 1 kit (354 mLs total) by mouth once for 1 dose. 354 mL 0   Ascorbic Acid (VITAMIN C) 100 MG tablet Take 100 mg by mouth daily.     DULoxetine (CYMBALTA) 30 MG capsule Take two capsules daily for pain mgmt. 180 capsule 1   fluticasone (FLONASE) 50 MCG/ACT nasal spray      levothyroxine (SYNTHROID) 50 MCG tablet TAKE 1 TABLET BY MOUTH EVERY DAY 90 tablet 2   lisinopril (ZESTRIL) 20 MG tablet Take 1 tablet (20 mg total) by mouth daily. (Patient taking differently: Take 20 mg by mouth daily. Taking 1/2 tablet) 90 tablet 3   metFORMIN (GLUCOPHAGE-XR) 750 MG 24 hr tablet Take 1 tablet (750 mg total) by mouth daily with breakfast. 90 tablet 3   Multiple Vitamins-Minerals (ZINC PO) Take by mouth.     No current facility-administered medications for this visit.    Patient confirms/reports the following allergies:  Allergies  Allergen Reactions   Other     Other  reaction(s): Headache, Other Other reaction(s): Headache, Other   Penicillins Rash    No orders of the defined types were placed in this encounter.   AUTHORIZATION INFORMATION Primary Insurance: 1D#: Group #:  Secondary Insurance: 1D#: Group #:  SCHEDULE INFORMATION: Date: 08/19/2023 Time: Location:  [facility]

## 2023-08-13 NOTE — Telephone Encounter (Signed)
 Pt dropped off this form.  Placing in Dr. Rexanne Mano box.  Call pt when ready to pick up.

## 2023-08-14 NOTE — Telephone Encounter (Signed)
 Form has been placed on the providers desk to be reviewed.

## 2023-08-18 ENCOUNTER — Encounter: Payer: Self-pay | Admitting: Gastroenterology

## 2023-08-19 ENCOUNTER — Ambulatory Visit: Admitting: Anesthesiology

## 2023-08-19 ENCOUNTER — Encounter: Payer: Self-pay | Admitting: Gastroenterology

## 2023-08-19 ENCOUNTER — Other Ambulatory Visit: Payer: Self-pay

## 2023-08-19 ENCOUNTER — Ambulatory Visit
Admission: RE | Admit: 2023-08-19 | Discharge: 2023-08-19 | Disposition: A | Discharge status: Home / Self Care | Attending: Gastroenterology | Admitting: Gastroenterology

## 2023-08-19 ENCOUNTER — Encounter
Admission: RE | Disposition: A | Discharge status: Home / Self Care | Payer: Self-pay | Source: Home / Self Care | Attending: Gastroenterology

## 2023-08-19 DIAGNOSIS — I10 Essential (primary) hypertension: Secondary | ICD-10-CM | POA: Insufficient documentation

## 2023-08-19 DIAGNOSIS — K640 First degree hemorrhoids: Secondary | ICD-10-CM

## 2023-08-19 DIAGNOSIS — Z1211 Encounter for screening for malignant neoplasm of colon: Secondary | ICD-10-CM

## 2023-08-19 DIAGNOSIS — K219 Gastro-esophageal reflux disease without esophagitis: Secondary | ICD-10-CM | POA: Insufficient documentation

## 2023-08-19 DIAGNOSIS — R7303 Prediabetes: Secondary | ICD-10-CM | POA: Insufficient documentation

## 2023-08-19 DIAGNOSIS — Z7984 Long term (current) use of oral hypoglycemic drugs: Secondary | ICD-10-CM | POA: Insufficient documentation

## 2023-08-19 HISTORY — PX: COLONOSCOPY: SHX5424

## 2023-08-19 LAB — GLUCOSE, CAPILLARY: Glucose-Capillary: 91 mg/dL (ref 70–99)

## 2023-08-19 SURGERY — COLONOSCOPY
Anesthesia: General

## 2023-08-19 MED ORDER — PROPOFOL 10 MG/ML IV BOLUS
INTRAVENOUS | Status: DC | PRN
  Administered 2023-08-19: 30 mg via INTRAVENOUS
  Administered 2023-08-19: 70 mg via INTRAVENOUS
  Administered 2023-08-19: 30 mg via INTRAVENOUS

## 2023-08-19 MED ORDER — PROPOFOL 500 MG/50ML IV EMUL
INTRAVENOUS | Status: DC | PRN
  Administered 2023-08-19: 125 ug/kg/min via INTRAVENOUS

## 2023-08-19 MED ORDER — LIDOCAINE HCL (CARDIAC) PF 100 MG/5ML IV SOSY
PREFILLED_SYRINGE | INTRAVENOUS | Status: DC | PRN
  Administered 2023-08-19: 50 mg via INTRAVENOUS

## 2023-08-19 MED ORDER — SODIUM CHLORIDE 0.9 % IV SOLN: INTRAVENOUS | Status: DC

## 2023-08-19 NOTE — H&P (Signed)
 Tami Minium, MD [facility] [address], [address] [place], [place] [address] Phone: [phone] Fax : [phone]  Primary Care Physician:  Tami Provencal, FNP Primary Gastroenterologist:  Dr. Servando Snare  Pre-Procedure History & Physical: HPI:  Tami Finley is a 58 y.o. female is here for a screening colonoscopy.   Past Medical History:  Diagnosis Date   Allergy    Anxiety    History of dysplastic nevus 10/13/2018   DYSPLASTIC COMPOUND NEVUS WITH MILD ATYPIA, CLOSE TO MARGIN  -  right foot dorsum   Hyperlipidemia    Hypertension    Thyroid disease     Past Surgical History:  Procedure Laterality Date   CHOLECYSTECTOMY  06/04/2007    Prior to Admission medications   Medication Sig Start Date End Date Taking? Authorizing Provider  Ascorbic Acid (VITAMIN C) 100 MG tablet Take 100 mg by mouth daily.    [provider]  DULoxetine (CYMBALTA) 30 MG capsule Take two capsules daily for pain mgmt. 07/23/23   Tami Provencal, FNP  fluticasone Aleda Grana) 50 MCG/ACT nasal spray  06/24/19   [provider]  levothyroxine (SYNTHROID) 50 MCG tablet TAKE 1 TABLET BY MOUTH EVERY DAY 03/27/23   Merita Norton T, FNP  lisinopril (ZESTRIL) 20 MG tablet Take 1 tablet (20 mg total) by mouth daily. Patient taking differently: Take 20 mg by mouth daily. Taking 1/2 tablet 04/16/23   Merita Norton T, FNP  metFORMIN (GLUCOPHAGE-XR) 750 MG 24 hr tablet Take 1 tablet (750 mg total) by mouth daily with breakfast. 04/16/23   Jacky Kindle, FNP  Multiple Vitamins-Minerals (ZINC PO) Take by mouth.    [provider]    Allergies as of 08/13/2023 - Review Complete 07/23/2023  Allergen Reaction Noted   Other     Penicillins Rash 03/01/2015    Family History  Problem Relation Age of Onset   Hypertension Mother    Hyperlipidemia Mother    Thyroid disease Mother    Hypertension Father    Hyperlipidemia Father    Diabetes Sister    Hypertension Sister    Thyroid disease Sister     Thyroid disease Maternal Grandmother    Breast cancer Other     Social History   Socioeconomic History   Marital status: Married    Spouse name: Not on file   Number of children: 2   Years of education: H/S   Highest education level: Not on file  Occupational History   Occupation: Full-Time  Tobacco Use   Smoking status: Never   Smokeless tobacco: Never  Vaping Use   Vaping status: Never Used  Substance and Sexual Activity   Alcohol use: Not Currently   Drug use: No   Sexual activity: Not Currently    Birth control/protection: Post-menopausal  Other Topics Concern   Not on file  Social History Narrative   Not on file   Social Drivers of Health   Financial Resource Strain: Not on file  Food Insecurity: Not on file  Transportation Needs: Not on file  Physical Activity: Not on file  Stress: Not on file  Social Connections: Not on file  Intimate Partner Violence: Not on file    Review of Systems: See HPI, otherwise negative ROS  Physical Exam: BP 136/71   Pulse 77   Temp (!) 97.2 F (36.2 C)   Wt 69.4 kg   LMP 07/23/2003 Comment: IUD.  LMP 12 yrs ago.  SpO2 100%   BMI 25.46 kg/m  General:  Alert,  pleasant and cooperative in NAD Head:  Normocephalic and atraumatic. Neck:  Supple; no masses or thyromegaly. Lungs:  Clear throughout to auscultation.    Heart:  Regular rate and rhythm. Abdomen:  Soft, nontender and nondistended. Normal bowel sounds, without guarding, and without rebound.   Neurologic:  Alert and  oriented x4;  grossly normal neurologically.  Impression/Plan: Tami Finley is now here to undergo a screening colonoscopy.  Risks, benefits, and alternatives regarding colonoscopy have been reviewed with the patient.  Questions have been answered.  All parties agreeable.

## 2023-08-19 NOTE — Op Note (Signed)
 [facility] Gastroenterology Patient Name: Tami Finley Procedure Date: 08/19/2023 9:53 AM MRN: [id number] Account #: [id number] Date of Birth: [birth year] Admit Type: Outpatient Age: 58 Room: [facility] ENDO ROOM 4 Gender: Female Note Status: Finalized Instrument Name: Prentice Docker [id number] Procedure:             Colonoscopy Indications:           Screening for colorectal malignant neoplasm Providers:             Midge Minium MD, MD Referring MD:          No Local Md, MD (Referring MD) Medicines:             Propofol per Anesthesia Complications:         No immediate complications. Procedure:             Pre-Anesthesia Assessment:                        - Prior to the procedure, a History and Physical was                         performed, and patient medications and allergies were                         reviewed. The patient's tolerance of previous                         anesthesia was also reviewed. The risks and benefits                         of the procedure and the sedation options and risks                         were discussed with the patient. All questions were                         answered, and informed consent was obtained. Prior                         Anticoagulants: The patient has taken no anticoagulant                         or antiplatelet agents. ASA Grade Assessment: II - A                         patient with mild systemic disease. After reviewing                         the risks and benefits, the patient was deemed in                         satisfactory condition to undergo the procedure.                        After obtaining informed consent, the colonoscope was                         passed under direct vision. Throughout the procedure,  the patient's blood pressure, pulse, and oxygen                         saturations were monitored continuously. The                         Colonoscope was introduced through the  anus and                         advanced to the the cecum, identified by appendiceal                         orifice and ileocecal valve. The colonoscopy was                         performed without difficulty. The patient tolerated                         the procedure well. The quality of the bowel                         preparation was excellent. Findings:      The perianal and digital rectal examinations were normal.      Non-bleeding internal hemorrhoids were found during retroflexion. The       hemorrhoids were Grade I (internal hemorrhoids that do not prolapse).      The exam was otherwise without abnormality. Impression:            - Non-bleeding internal hemorrhoids.                        - The examination was otherwise normal.                        - No specimens collected. Recommendation:        - Discharge patient to home.                        - Resume previous diet.                        - Continue present medications.                        - Await pathology results.                        - Repeat colonoscopy in 10 years for screening                         purposes. Procedure Code(s):     --- Professional ---                        [phone], Colonoscopy, flexible; diagnostic, including                         collection of specimen(s) by brushing or washing, when                         performed (separate procedure) Diagnosis Code(s):     --- Professional ---  Z12.11, Encounter for screening for malignant neoplasm                         of colon CPT copyright 2022 American Medical Association. All rights reserved. The codes documented in this report are preliminary and upon coder review may  be revised to meet current compliance requirements. Midge Minium MD, MD 08/19/2023 10:12:31 AM This report has been signed electronically. Number of Addenda: 0 Note Initiated On: 08/19/2023 9:53 AM Scope Withdrawal Time: 0 hours 7 minutes 27 seconds   Total Procedure Duration: 0 hours 10 minutes 48 seconds  Estimated Blood Loss:  Estimated blood loss: none.      [facility]

## 2023-08-19 NOTE — Anesthesia Preprocedure Evaluation (Signed)
 Anesthesia Evaluation  Patient identified by MRN, date of birth, ID band Patient awake    Reviewed: Allergy & Precautions, H&P , NPO status , Patient's Chart, lab work & pertinent test results, reviewed documented beta blocker date and time   History of Anesthesia Complications Negative for: history of anesthetic complications  Airway Mallampati: I  TM Distance: >3 FB Neck ROM: full    Dental  (+) Dental Advidsory Given, Caps, Missing   Pulmonary neg pulmonary ROS   Pulmonary exam normal breath sounds clear to auscultation       Cardiovascular Exercise Tolerance: Good hypertension, (-) angina (-) Past MI and (-) Cardiac Stents Normal cardiovascular exam(-) dysrhythmias (-) Valvular Problems/Murmurs Rhythm:regular Rate:Normal     Neuro/Psych  PSYCHIATRIC DISORDERS Anxiety     negative neurological ROS     GI/Hepatic ,GERD  Medicated and Controlled,,NAFLD   Endo/Other  diabetes (borderline), Oral Hypoglycemic AgentsHypothyroidism    Renal/GU negative Renal ROS  negative genitourinary   Musculoskeletal   Abdominal   Peds  Hematology negative hematology ROS (+)   Anesthesia Other Findings Past Medical History: No date: Allergy No date: Anxiety 10/13/2018: History of dysplastic nevus     Comment:  DYSPLASTIC COMPOUND NEVUS WITH MILD ATYPIA, CLOSE TO               MARGIN  -  right foot dorsum No date: Hyperlipidemia No date: Hypertension No date: Thyroid disease   Reproductive/Obstetrics negative OB ROS                             Anesthesia Physical Anesthesia Plan  ASA: 2  Anesthesia Plan: General   Post-op Pain Management:    Induction: Intravenous  PONV Risk Score and Plan: 3 and Propofol infusion, TIVA and Treatment may vary due to age or medical condition  Airway Management Planned: Natural Airway and Nasal Cannula  Additional Equipment:   Intra-op Plan:    Post-operative Plan:   Informed Consent: I have reviewed the patients History and Physical, chart, labs and discussed the procedure including the risks, benefits and alternatives for the proposed anesthesia with the patient or authorized representative who has indicated his/her understanding and acceptance.     Dental Advisory Given  Plan Discussed with: Anesthesiologist, CRNA and Surgeon  Anesthesia Plan Comments:        Anesthesia Quick Evaluation

## 2023-08-19 NOTE — Transfer of Care (Signed)
 Immediate Anesthesia Transfer of Care Note  Patient: Tami Finley  Procedure(s) Performed: COLONOSCOPY  Patient Location: PACU and Endoscopy Unit  Anesthesia Type:General  Level of Consciousness: sedated  Airway & Oxygen Therapy: Patient Spontanous Breathing  Post-op Assessment: Report given to RN and Post -op Vital signs reviewed and stable  Post vital signs: Reviewed and stable  Last Vitals:  Vitals Value Taken Time  BP 115/70 08/19/23 1017  Temp    Pulse 75 08/19/23 1017  Resp    SpO2 98 % 08/19/23 1017    Last Pain:  Vitals:   08/19/23 0939  PainSc: 0-No pain         Complications: No notable events documented.

## 2023-08-19 NOTE — Anesthesia Postprocedure Evaluation (Signed)
 Anesthesia Post Note  Patient: Tami Finley  Procedure(s) Performed: COLONOSCOPY  Patient location during evaluation: Endoscopy Anesthesia Type: General Level of consciousness: awake and alert Pain management: pain level controlled Vital Signs Assessment: post-procedure vital signs reviewed and stable Respiratory status: spontaneous breathing, nonlabored ventilation, respiratory function stable and patient connected to nasal cannula oxygen Cardiovascular status: blood pressure returned to baseline and stable Postop Assessment: no apparent nausea or vomiting Anesthetic complications: no   No notable events documented.   Last Vitals:  Vitals:   08/19/23 1026 08/19/23 1046  BP: (!) 103/59 (!) 105/58  Pulse: 76   Resp: 17   Temp:    SpO2: 100%     Last Pain:  Vitals:   08/19/23 1026  PainSc: 0-No pain                 Lenard Simmer

## 2023-08-20 ENCOUNTER — Encounter: Payer: Self-pay | Admitting: Gastroenterology

## 2023-08-21 NOTE — Telephone Encounter (Signed)
 Pt came to get waist measured.  Pt was given the original.  We also faxed and put copy up for scan into the chart.

## 2023-10-07 ENCOUNTER — Encounter: Payer: Self-pay | Admitting: Dermatology

## 2023-10-07 ENCOUNTER — Ambulatory Visit (INDEPENDENT_AMBULATORY_CARE_PROVIDER_SITE_OTHER): Payer: Managed Care, Other (non HMO) | Admitting: Dermatology

## 2023-10-07 DIAGNOSIS — D229 Melanocytic nevi, unspecified: Secondary | ICD-10-CM

## 2023-10-07 DIAGNOSIS — L578 Other skin changes due to chronic exposure to nonionizing radiation: Secondary | ICD-10-CM | POA: Diagnosis not present

## 2023-10-07 DIAGNOSIS — B079 Viral wart, unspecified: Secondary | ICD-10-CM

## 2023-10-07 DIAGNOSIS — L821 Other seborrheic keratosis: Secondary | ICD-10-CM

## 2023-10-07 DIAGNOSIS — Z1283 Encounter for screening for malignant neoplasm of skin: Secondary | ICD-10-CM | POA: Diagnosis not present

## 2023-10-07 DIAGNOSIS — L814 Other melanin hyperpigmentation: Secondary | ICD-10-CM | POA: Diagnosis not present

## 2023-10-07 DIAGNOSIS — Z86018 Personal history of other benign neoplasm: Secondary | ICD-10-CM

## 2023-10-07 DIAGNOSIS — D2272 Melanocytic nevi of left lower limb, including hip: Secondary | ICD-10-CM

## 2023-10-07 DIAGNOSIS — D225 Melanocytic nevi of trunk: Secondary | ICD-10-CM

## 2023-10-07 DIAGNOSIS — W908XXA Exposure to other nonionizing radiation, initial encounter: Secondary | ICD-10-CM

## 2023-10-07 DIAGNOSIS — D2239 Melanocytic nevi of other parts of face: Secondary | ICD-10-CM

## 2023-10-07 DIAGNOSIS — D1801 Hemangioma of skin and subcutaneous tissue: Secondary | ICD-10-CM

## 2023-10-07 NOTE — Progress Notes (Signed)
 Follow-Up Visit   Subjective  Tami Finley is a 58 y.o. female who presents for the following: Skin Cancer Screening and Full Body Skin Exam. Patient with hx of dysplastic nevus. Patient has place at L thumb, painful, has been present for about a year.   The patient presents for Total-Body Skin Exam (TBSE) for skin cancer screening and mole check. The patient has spots, moles and lesions to be evaluated, some may be new or changing and the patient may have concern these could be cancer.   The following portions of the chart were reviewed this encounter and updated as appropriate: medications, allergies, medical history  Review of Systems:  No other skin or systemic complaints except as noted in HPI or Assessment and Plan.  Objective  Well appearing patient in no apparent distress; mood and affect are within normal limits.  A full examination was performed including scalp, head, eyes, ears, nose, lips, neck, chest, axillae, abdomen, back, buttocks, bilateral upper extremities, bilateral lower extremities, hands, feet, fingers, toes, fingernails, and toenails. All findings within normal limits unless otherwise noted below.   Relevant physical exam findings are noted in the Assessment and Plan.  L thumb subungual 7mm verrucous papule -- Discussed viral etiology and contagion.  Assessment & Plan   SKIN CANCER SCREENING PERFORMED TODAY.  ACTINIC DAMAGE - Chronic condition, secondary to cumulative UV/sun exposure - diffuse scaly erythematous macules with underlying dyspigmentation - Recommend daily broad spectrum sunscreen SPF 30+ to sun-exposed areas, reapply every 2 hours as needed.  - Staying in the shade or wearing long sleeves, sun glasses (UVA+UVB protection) and wide brim hats (4-inch brim around the entire circumference of the hat) are also recommended for sun protection.  - Call for new or changing lesions.  LENTIGINES, SEBORRHEIC KERATOSES, HEMANGIOMAS - Benign normal skin  lesions - Benign-appearing - Call for any changes   MELANOCYTIC NEVI - Tan-brown and/or pink-flesh-colored symmetric macules and papules - 0.2 cm medium dark brown macule at left post thigh - 0.35 cm med dark brown macule with darker center at left pretibia - 0.3 cm med brown papule at left great toe - 0.3 cm medium light brown papule at left 3rd toe - 0.4 cm flesh papule at right nasal ala - 4 x 3 mm speckled brown macule at right upper back - Benign appearing on exam today - Observation - Call clinic for new or changing moles - Recommend daily use of broad spectrum spf 30+ sunscreen to sun-exposed areas.   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  Call if any new or changing lesions are noted between office visits   VIRAL WARTS, UNSPECIFIED TYPE L thumb subungual Discussed treatment with cryotherapy or WartPeel Rx to compounding pharmacy, patient prefers to treat today with cryotherapy.  May take more than one treatment of cryotherapy. Discussed nail may get temporary black spot, may lift up. Apply Vaseline or abx ointment and place band-aid over.    Viral Wart (HPV) Counseling  Discussed viral / HPV (Human Papilloma Virus) etiology and risk of spread /infectivity to other areas of body as well as to other people.  Multiple treatments and methods may be required to clear warts and it is possible treatment may not be successful.  Treatment risks include discoloration; scarring  and there is still potential for wart recurrence. Destruction of lesion - L thumb subungual  Destruction method: cryotherapy   Informed consent: discussed and consent obtained   Lesion destroyed using liquid nitrogen: Yes   Region frozen until ice ball  extended beyond lesion: Yes   Outcome: patient tolerated procedure well with no complications   Post-procedure details: wound care instructions given   Additional details:  Prior to procedure, discussed risks of blister formation, small wound, skin dyspigmentation, or rare scar following cryotherapy. Recommend Vaseline ointment to treated areas while healing.   Return for 1 month wart f/u, 1 year TBSE, hx dysplastic nevus , w/ Dr. Annette Barters.  I, Jacquelynn Vera, CMA, am acting as scribe for Artemio Larry, MD .   Documentation: I have reviewed the above documentation for accuracy and completeness, and I agree with the above.  Artemio Larry, MD

## 2023-10-07 NOTE — Patient Instructions (Addendum)

## 2023-11-11 ENCOUNTER — Ambulatory Visit: Admitting: Dermatology

## 2023-11-18 ENCOUNTER — Ambulatory Visit: Admitting: Dermatology

## 2023-11-18 DIAGNOSIS — B079 Viral wart, unspecified: Secondary | ICD-10-CM | POA: Diagnosis not present

## 2023-11-18 NOTE — Progress Notes (Signed)
   Follow-Up Visit   Subjective  Tami Finley is a 58 y.o. female who presents for the following: Wart at the left thumb subungual. Area was treated with cryotherapy 10/07/23 and came off, but has grown back.   The following portions of the chart were reviewed this encounter and updated as appropriate: medications, allergies, medical history  Review of Systems:  No other skin or systemic complaints except as noted in HPI or Assessment and Plan.  Objective  Well appearing patient in no apparent distress; mood and affect are within normal limits.  Areas Examined: Left thumb  Relevant physical exam findings are noted in the Assessment and Plan.  Left Subungual Thumbnail  7 mm verrucous papule left subungual thumb  Assessment & Plan   VIRAL WARTS, UNSPECIFIED TYPE Left Subungual Thumbnail Viral Wart (HPV) Counseling  Discussed viral / HPV (Human Papilloma Virus) etiology and risk of spread /infectivity to other areas of body as well as to other people.  Multiple treatments and methods may be required to clear warts and it is possible treatment may not be successful.  Treatment risks include discoloration; scarring and there is still potential for wart recurrence.  Discussed topical WartPeel vs Candida injections if not improving with cryotherapy.  Destruction of lesion - Left Subungual Thumbnail  Destruction method: cryotherapy   Informed consent: discussed and consent obtained   Lesion destroyed using liquid nitrogen: Yes   Region frozen until ice ball extended beyond lesion: Yes   Outcome: patient tolerated procedure well with no complications   Post-procedure details: wound care instructions given   Additional details:  Prior to procedure, discussed risks of blister formation, small wound, skin dyspigmentation, or rare scar following cryotherapy. Recommend Vaseline ointment to treated areas while healing.   Destruction of lesion - Left Subungual Thumbnail  Destruction method:  chemical removal   Informed consent: discussed and consent obtained   Timeout:  patient name, date of birth, surgical site, and procedure verified Chemical destruction method: cantharidin   Application time:  4 hours Procedure instructions: patient instructed to wash and dry area   Outcome: patient tolerated procedure well with no complications   Post-procedure details: wound care instructions given   Additional details:  Cantharidin Plus is a blistering agent that comes from a beetle.  It needs to be washed off in about 4-6 hours after application.  Although it is painless when applied in office, it may cause symptoms of mild pain and burning several hours later.  Treated areas will swell and turn red, and blisters may form.  Vaseline and a bandaid may be applied until wound has healed.  Once healed, the skin may remain temporarily discolored.  It can take weeks to months for pigmentation to return to normal.  Advised to wash off with soap and water in 4-6 hours or sooner if it becomes tender before then.    Return in about 1 month (around 12/19/2023) for wart.  IAndrea Kerns, CMA, am acting as scribe for Rexene Rattler, MD .   Documentation: I have reviewed the above documentation for accuracy and completeness, and I agree with the above.  Rexene Rattler, MD

## 2023-11-18 NOTE — Patient Instructions (Addendum)
 Cantharidin Plus is a blistering agent that comes from a beetle.  It needs to be washed off in about 4-6 hours after application.  Although it is painless when applied in office, it may cause symptoms of mild pain and burning several hours later.  Treated areas will swell and turn red, and blisters may form.  Vaseline and a bandaid may be applied until wound has healed.  Once healed, the skin may remain temporarily discolored.  It can take weeks to months for pigmentation to return to normal.  Advised to wash off with soap and water in 4-6 hours or sooner if it becomes tender before then.   Cryotherapy Aftercare  Wash gently with soap and water everyday.   Apply Vaseline and Band-Aid daily until healed.   Viral Warts & Molluscum Contagiosum  Viral warts and molluscum contagiosum are growths of the skin caused by viral infection of the skin. If you have been given the diagnosis of viral warts or molluscum contagiosum there are a few things that you must understand about your condition:  There is no guaranteed treatment method available for this condition. Multiple treatments may be required, The treatments may be time consuming and require multiple visits to the dermatology office. The treatment may be expensive. You will be charged each time you come into the office to have the spots treated. The treated areas may develop new lesions further complicating treatment. The treated areas may leave a scar. There is no guarantee that even after multiple treatments that the spots will be successfully treated. These are caused by a viral infection and can be spread to other areas of the skin and to other people by direct contact. Therefore, new spots may occur.  Due to recent changes in healthcare laws, you may see results of your pathology and/or laboratory studies on MyChart before the doctors have had a chance to review them. We understand that in some cases there may be results that are confusing or  concerning to you. Please understand that not all results are received at the same time and often the doctors may need to interpret multiple results in order to provide you with the best plan of care or course of treatment. Therefore, we ask that you please give us  2 business days to thoroughly review all your results before contacting the office for clarification. Should we see a critical lab result, you will be contacted sooner.   If You Need Anything After Your Visit  If you have any questions or concerns for your doctor, please call our main line at [phone] and press option 4 to reach your doctor's medical assistant. If no one answers, please leave a voicemail as directed and we will return your call as soon as possible. Messages left after 4 pm will be answered the following business day.   You may also send us  a message via MyChart. We typically respond to MyChart messages within 1-2 business days.  For prescription refills, please ask your pharmacy to contact our office. Our fax number is [phone].  If you have an urgent issue when the clinic is closed that cannot wait until the next business day, you can page your doctor at the number below.    Please note that while we do our best to be available for urgent issues outside of office hours, we are not available 24/7.   If you have an urgent issue and are unable to reach us , you may choose to seek medical care at your  doctor's office, retail clinic, urgent care center, or emergency room.  If you have a medical emergency, please immediately call 911 or go to the emergency department.  Pager Numbers  - Dr. Hester: [phone]  - Dr. Jackquline: [phone]  - Dr. Claudene: [phone]   In the event of inclement weather, please call our main line at [phone] for an update on the status of any delays or closures.  Dermatology Medication Tips: Please keep the boxes that topical medications come in in order to help keep  track of the instructions about where and how to use these. Pharmacies typically print the medication instructions only on the boxes and not directly on the medication tubes.   If your medication is too expensive, please contact our office at [phone] option 4 or send us  a message through MyChart.   We are unable to tell what your co-pay for medications will be in advance as this is different depending on your insurance coverage. However, we may be able to find a substitute medication at lower cost or fill out paperwork to get insurance to cover a needed medication.   If a prior authorization is required to get your medication covered by your insurance company, please allow us  1-2 business days to complete this process.  Drug prices often vary depending on where the prescription is filled and some pharmacies may offer cheaper prices.  The website www.goodrx.com contains coupons for medications through different pharmacies. The prices here do not account for what the cost may be with help from insurance (it may be cheaper with your insurance), but the website can give you the price if you did not use any insurance.  - You can print the associated coupon and take it with your prescription to the pharmacy.  - You may also stop by our office during regular business hours and pick up a GoodRx coupon card.  - If you need your prescription sent electronically to a different pharmacy, notify our office through [facility] or by phone at [phone] option 4.     Si Usted Necesita Algo Despus de Su Visita  Tambin puede enviarnos un mensaje a travs de Clinical cytogeneticist. Por lo general respondemos a los mensajes de MyChart en el transcurso de 1 a 2 das hbiles.  Para renovar recetas, por favor pida a su farmacia que se ponga en contacto con nuestra oficina. Randi lakes de fax es [place] [phone].  Si tiene un asunto urgente cuando la clnica est cerrada y que no puede esperar hasta el  siguiente da hbil, puede llamar/localizar a su doctor(a) al nmero que aparece a continuacin.   Por favor, tenga en cuenta que aunque hacemos todo lo posible para estar disponibles para asuntos urgentes fuera del horario de [place], no estamos disponibles las 24 horas del da, los 7 [address]  [address] de la [place].   Si tiene un problema urgente y no puede comunicarse con nosotros, puede optar por buscar atencin mdica  en el consultorio de su doctor(a), en una clnica privada, en un centro de atencin urgente o en una sala de emergencias.  Si tiene Engineer, drilling, por favor llame inmediatamente al 911 o vaya a la sala de emergencias.  Nmeros de bper  - Dr. Hester: [phone]  - Dra. Jackquline: [phone]  - Dr. Claudene: [phone]   En caso de inclemencias del tiempo, por favor llame a landry capes principal al [phone] para una actualizacin sobre el [place] de cualquier retraso o cierre.  Consejos para la medicacin en  dermatologa: Por favor, guarde las cajas en las que vienen los medicamentos de uso tpico para ayudarle a seguir las instrucciones sobre dnde y cmo usarlos. Las farmacias generalmente imprimen las instrucciones del medicamento slo en las cajas y no directamente en los tubos del [place].   Si su medicamento es muy caro, por favor, pngase en contacto con landry rieger llamando al [phone] y presione la opcin 4 o envenos un mensaje a travs de Clinical cytogeneticist.   No podemos decirle cul ser su copago por los medicamentos por adelantado ya que esto es diferente dependiendo de la cobertura de su seguro. Sin embargo, es posible que podamos encontrar un medicamento sustituto a Audiological scientist un formulario para que el seguro cubra el medicamento que se considera necesario.   Si se requiere una autorizacin previa para que su compaa de seguros malta su medicamento, por favor permtanos de 1 a 2 das hbiles para completar este proceso.  Los precios de los  medicamentos varan con frecuencia dependiendo del Environmental consultant de dnde se surte la receta y alguna farmacias pueden ofrecer precios ms baratos.  El sitio web www.goodrx.com tiene cupones para medicamentos de Health and safety inspector. Los precios aqu no tienen en cuenta lo que podra costar con la ayuda del seguro (puede ser ms barato con su seguro), pero el sitio web puede darle el precio si no utiliz Tourist information centre manager.  - Puede imprimir el cupn correspondiente y llevarlo con su receta a la farmacia.  - Tambin puede pasar por nuestra oficina durante el horario de atencin regular y Education officer, museum una tarjeta de cupones de GoodRx.  - Si necesita que su receta se enve electrnicamente a una farmacia diferente, informe a nuestra oficina a travs de MyChart de [place] o por telfono llamando al [phone] y presione la opcin 4.

## 2023-12-16 ENCOUNTER — Encounter: Payer: Self-pay | Admitting: Dermatology

## 2023-12-16 ENCOUNTER — Ambulatory Visit: Admitting: Dermatology

## 2023-12-16 DIAGNOSIS — B078 Other viral warts: Secondary | ICD-10-CM | POA: Diagnosis not present

## 2023-12-16 NOTE — Progress Notes (Addendum)
   Follow-Up Visit   Subjective  Tami Finley is a 58 y.o. female who presents for the following: Wart follow up. Left subungual thumbnail. Patient reports wart seems smaller.    The following portions of the chart were reviewed this encounter and updated as appropriate: medications, allergies, medical history  Review of Systems:  No other skin or systemic complaints except as noted in HPI or Assessment and Plan.  Objective  Well appearing patient in no apparent distress; mood and affect are within normal limits.  Areas Examined: Left hand  Relevant physical exam findings are noted in the Assessment and Plan.  left subungual thumbnail Verrucous papule with thinning -- Discussed viral etiology and contagion.   Assessment & Plan   OTHER VIRAL WARTS left subungual thumbnail Improved, verrucous papule is thinner today   Viral Wart (HPV) Counseling  Discussed viral / HPV (Human Papilloma Virus) etiology and risk of spread /infectivity to other areas of body as well as to other people.  Multiple treatments and methods may be required to clear warts and it is possible treatment may not be successful.  Treatment risks include discoloration; scarring and there is still potential for wart recurrence.  After healing, pt will apply topical OTC sal acid wart remover qhs Destruction of lesion - left subungual thumbnail  Destruction method: cryotherapy   Informed consent: discussed and consent obtained   Lesion destroyed using liquid nitrogen: Yes   Region frozen until ice ball extended beyond lesion: Yes   Outcome: patient tolerated procedure well with no complications   Post-procedure details: wound care instructions given   Additional details:  Prior to procedure, discussed risks of blister formation, small wound, skin dyspigmentation, or rare scar following cryotherapy. Recommend Vaseline ointment to treated areas while healing.   Destruction of lesion - left subungual  thumbnail  Destruction method: chemical removal   Informed consent: discussed and consent obtained   Timeout:  patient name, date of birth, surgical site, and procedure verified Chemical destruction method comment:  Cantharone plus Application time:  4 hours Procedure instructions: patient instructed to wash and dry area   Procedure instructions comment:  Wash off in 4 hours Outcome: patient tolerated procedure well with no complications   Post-procedure details: wound care instructions given   Additional details:  Cantharidin Plus is a blistering agent that comes from a beetle.  It needs to be washed off in about 4 hours after application.  Although it is painless when applied in office, it may cause symptoms of mild pain and burning several hours later.  Treated areas will swell and turn red, and blisters may form.  Vaseline and a bandaid may be applied until wound has healed.  Once healed, the skin may remain temporarily discolored.  It can take weeks to months for pigmentation to return to normal.  Advised to wash off with soap and water in 4 hours or sooner if it becomes tender before then.     Return for Wart Follow UP in 4-6 weeks.  I, Jill Parcell, CMA, am acting as scribe for Rexene Rattler, MD.   Documentation: I have reviewed the above documentation for accuracy and completeness, and I agree with the above.  Rexene Rattler, MD

## 2023-12-16 NOTE — Patient Instructions (Addendum)
 Cryotherapy Aftercare  Wash gently with soap and water everyday.   Apply Vaseline and Band-Aid daily until healed.     Instructions for After In-Office Application of Cantharidin  1. This is a strong medicine; please follow ALL instructions.  2. Gently wash off with soap and water in four hours or sooner as directed by your physician.  3. **WARNING** this medicine can cause severe blistering, blood blisters, infection, and/or scarring if it is not washed off as directed.  4. Your progress will be rechecked in 1-2 months; call sooner if there are any questions or problems.    Cantharidin Plus is a blistering agent that comes from a beetle.  It needs to be washed off in about 4 hours after application.  Although it is painless when applied in office, it may cause symptoms of mild pain and burning several hours later.  Treated areas will swell and turn red, and blisters may form.  Vaseline and a bandaid may be applied until wound has healed.  Once healed, the skin may remain temporarily discolored.  It can take weeks to months for pigmentation to return to normal.  Advised to wash off with soap and water in 4 hours or sooner if it becomes tender before then.   Once healed can use OTC wart treatment, as directed on label, between visits.    Due to recent changes in healthcare laws, you may see results of your pathology and/or laboratory studies on MyChart before the doctors have had a chance to review them. We understand that in some cases there may be results that are confusing or concerning to you. Please understand that not all results are received at the same time and often the doctors may need to interpret multiple results in order to provide you with the best plan of care or course of treatment. Therefore, we ask that you please give us  2 business days to thoroughly review all your results before contacting the office for clarification. Should we see a critical lab result, you will be  contacted sooner.   If You Need Anything After Your Visit  If you have any questions or concerns for your doctor, please call our main line at [phone] and press option 4 to reach your doctor's medical assistant. If no one answers, please leave a voicemail as directed and we will return your call as soon as possible. Messages left after 4 pm will be answered the following business day.   You may also send us  a message via MyChart. We typically respond to MyChart messages within 1-2 business days.  For prescription refills, please ask your pharmacy to contact our office. Our fax number is [phone].  If you have an urgent issue when the clinic is closed that cannot wait until the next business day, you can page your doctor at the number below.    Please note that while we do our best to be available for urgent issues outside of office hours, we are not available 24/7.   If you have an urgent issue and are unable to reach us , you may choose to seek medical care at your doctor's office, retail clinic, urgent care center, or emergency room.  If you have a medical emergency, please immediately call 911 or go to the emergency department.  Pager Numbers  - Dr. Hester: [phone]  - Dr. Jackquline: [phone]  - Dr. Claudene: [phone]   In the event of inclement weather, please call our main line at [phone] for an update  on the status of any delays or closures.  Dermatology Medication Tips: Please keep the boxes that topical medications come in in order to help keep track of the instructions about where and how to use these. Pharmacies typically print the medication instructions only on the boxes and not directly on the medication tubes.   If your medication is too expensive, please contact our office at [phone] option 4 or send us  a message through MyChart.   We are unable to tell what your co-pay for medications will be in advance as this is different depending on  your insurance coverage. However, we may be able to find a substitute medication at lower cost or fill out paperwork to get insurance to cover a needed medication.   If a prior authorization is required to get your medication covered by your insurance company, please allow us  1-2 business days to complete this process.  Drug prices often vary depending on where the prescription is filled and some pharmacies may offer cheaper prices.  The website www.goodrx.com contains coupons for medications through different pharmacies. The prices here do not account for what the cost may be with help from insurance (it may be cheaper with your insurance), but the website can give you the price if you did not use any insurance.  - You can print the associated coupon and take it with your prescription to the pharmacy.  - You may also stop by our office during regular business hours and pick up a GoodRx coupon card.  - If you need your prescription sent electronically to a different pharmacy, notify our office through [facility] or by phone at [phone] option 4.     Si Usted Necesita Algo Despus de Su Visita  Tambin puede enviarnos un mensaje a travs de Clinical cytogeneticist. Por lo general respondemos a los mensajes de MyChart en el transcurso de 1 a 2 das hbiles.  Para renovar recetas, por favor pida a su farmacia que se ponga en contacto con nuestra oficina. Randi lakes de fax es [place] [phone].  Si tiene un asunto urgente cuando la clnica est cerrada y que no puede esperar hasta el siguiente da hbil, puede llamar/localizar a su doctor(a) al nmero que aparece a continuacin.   Por favor, tenga en cuenta que aunque hacemos todo lo posible para estar disponibles para asuntos urgentes fuera del horario de [place], no estamos disponibles las 24 horas del da, los 7 [address]  [address] de la [place].   Si tiene un problema urgente y no puede comunicarse con nosotros, puede optar por buscar atencin mdica  en el  consultorio de su doctor(a), en una clnica privada, en un centro de atencin urgente o en una sala de emergencias.  Si tiene Engineer, drilling, por favor llame inmediatamente al 911 o vaya a la sala de emergencias.  Nmeros de bper  - Dr. Hester: [phone]  - Dra. Jackquline: [phone]  - Dr. Claudene: [phone]   En caso de inclemencias del tiempo, por favor llame a landry capes principal al [phone] para una actualizacin sobre el [place] de cualquier retraso o cierre.  Consejos para la medicacin en dermatologa: Por favor, guarde las cajas en las que vienen los medicamentos de uso tpico para ayudarle a seguir las instrucciones sobre dnde y cmo usarlos. Las farmacias generalmente imprimen las instrucciones del medicamento slo en las cajas y no directamente en los tubos del [place].   Si su medicamento es [employer], por favor, pngase en contacto con nuestra oficina llamando  al [phone] y presione la opcin 4 o envenos un mensaje a travs de Clinical cytogeneticist.   No podemos decirle cul ser su copago por los medicamentos por adelantado ya que esto es diferente dependiendo de la cobertura de su seguro. Sin embargo, es posible que podamos encontrar un medicamento sustituto a Audiological scientist un formulario para que el seguro cubra el medicamento que se considera necesario.   Si se requiere una autorizacin previa para que su compaa de seguros malta su medicamento, por favor permtanos de 1 a 2 das hbiles para completar este proceso.  Los precios de los medicamentos varan con frecuencia dependiendo del Environmental consultant de dnde se surte la receta y alguna farmacias pueden ofrecer precios ms baratos.  El sitio web www.goodrx.com tiene cupones para medicamentos de Health and safety inspector. Los precios aqu no tienen en cuenta lo que podra costar con la ayuda del seguro (puede ser ms barato con su seguro), pero el sitio web puede darle el precio si no utiliz Tourist information centre manager.  -  Puede imprimir el cupn correspondiente y llevarlo con su receta a la farmacia.  - Tambin puede pasar por nuestra oficina durante el horario de atencin regular y Education officer, museum una tarjeta de cupones de GoodRx.  - Si necesita que su receta se enve electrnicamente a una farmacia diferente, informe a nuestra oficina a travs de MyChart de [place] o por telfono llamando al [phone] y presione la opcin 4.

## 2023-12-22 ENCOUNTER — Telehealth: Payer: Self-pay | Admitting: Family Medicine

## 2023-12-22 ENCOUNTER — Other Ambulatory Visit: Payer: Self-pay

## 2023-12-22 DIAGNOSIS — E039 Hypothyroidism, unspecified: Secondary | ICD-10-CM

## 2023-12-22 MED ORDER — LEVOTHYROXINE SODIUM 50 MCG PO TABS: 50.0000 ug | ORAL_TABLET | Freq: Every day | ORAL | 2 refills | Status: DC

## 2023-12-22 NOTE — Telephone Encounter (Signed)
[facility] faxed refill request for the following medications:  levothyroxine (SYNTHROID) 50 MCG tablet   Please advise.  

## 2023-12-22 NOTE — Telephone Encounter (Signed)
 Converted into a refill request

## 2023-12-24 ENCOUNTER — Ambulatory Visit (INDEPENDENT_AMBULATORY_CARE_PROVIDER_SITE_OTHER): Admitting: Family Medicine

## 2023-12-24 ENCOUNTER — Encounter: Payer: Self-pay | Admitting: Family Medicine

## 2023-12-24 VITALS — BP 138/80 | HR 84 | Temp 98.9°F | Ht 65.0 in | Wt 160.2 lb

## 2023-12-24 DIAGNOSIS — E039 Hypothyroidism, unspecified: Secondary | ICD-10-CM | POA: Diagnosis not present

## 2023-12-24 DIAGNOSIS — R1312 Dysphagia, oropharyngeal phase: Secondary | ICD-10-CM

## 2023-12-24 DIAGNOSIS — R7303 Prediabetes: Secondary | ICD-10-CM | POA: Diagnosis not present

## 2023-12-24 DIAGNOSIS — I10 Essential (primary) hypertension: Secondary | ICD-10-CM

## 2023-12-24 DIAGNOSIS — E782 Mixed hyperlipidemia: Secondary | ICD-10-CM | POA: Diagnosis not present

## 2023-12-24 DIAGNOSIS — K219 Gastro-esophageal reflux disease without esophagitis: Secondary | ICD-10-CM

## 2023-12-24 MED ORDER — LISINOPRIL 20 MG PO TABS: 20.0000 mg | ORAL_TABLET | Freq: Every day | ORAL | 3 refills | Status: DC

## 2023-12-24 NOTE — Progress Notes (Signed)
 Established Patient Office Visit  Introduced to nurse practitioner role and practice setting.  All questions answered.  Discussed provider/patient relationship and expectations.  Subjective   Patient ID: Tami Finley, female    DOB: [birth year]  Age: 58 y.o. MRN: [id number]  Chief Complaint  Patient presents with   Medical Management of Chronic Issues    Declined to all vaccines.   Hypertension    Hypertension Patient is here for follow-up of elevated blood pressure. She is not exercising and is adherent to a low-salt diet. Patient reports that she hasn't really been checking her blood pressure at home if it crosses her mind.   Discussed the use of AI scribe software for clinical note transcription with the patient, who gave verbal consent to proceed.  History of Present Illness Tami Finley is a 58 year old female with hypertension and GERD who presents for follow-up of her chronic conditions.  Hypertension - Does not frequently monitor blood pressure at home - Currently taking lisinopril  20 mg daily  Cough and upper airway symptoms - Frequent coughing and and phlegm in the throat - Attributes symptoms to postnasal drip or gastric reflux - Daughter noticed increased coughing after COVID-19 infection in April  Gastroesophageal reflux disease (gerd) and indigestion - Persistent symptoms despite doubling omeprazole dose - Takes Tums nightly for symptom relief - Experiences indigestion - No endoscopy performed in many years - Concerned about potential for throat issues due to GERD  Pre-diabetes and metformin  use - Currently taking metformin  750 mg daily - Desires to discontinue metformin   Gastrointestinal side effects - Occasional diarrhea, suspected to be related to multivitamins and supplements      12/24/2023    3:09 PM 04/15/2023    1:08 PM 07/22/2022    1:06 PM  Depression screen PHQ 2/9  Decreased Interest 0 0 0  Down, Depressed, Hopeless 0 0 0  PHQ - 2 Score 0 0 0   Altered sleeping 0 0 0  Tired, decreased energy 0 0 1  Change in appetite 0 0 0  Feeling bad or failure about yourself  0 0 0  Trouble concentrating 0 0 0  Moving slowly or fidgety/restless 0 0 0  Suicidal thoughts 0 0 0  PHQ-9 Score 0 0 1  Difficult doing work/chores Not difficult at all Not difficult at all Not difficult at all       12/24/2023    3:09 PM 04/15/2023    1:08 PM 03/26/2021    3:30 PM 03/24/2020    9:46 AM  GAD 7 : Generalized Anxiety Score  Nervous, Anxious, on Edge 0 0 0 0  Control/stop worrying 0 0 0 0  Worry too much - different things 0 0 0 0  Trouble relaxing 0 0 0 0  Restless 0 0 0 0  Easily annoyed or irritable 0 0 0 0  Afraid - awful might happen 0 0 0 0  Total GAD 7 Score 0 0 0 0  Anxiety Difficulty Not difficult at all Not difficult at all       ROS  Negative unless indicated in HPI   Objective:     BP 138/80 (BP Location: Left Arm, Patient Position: Sitting, Cuff Size: Normal)   Pulse 84   Temp 98.9 F (37.2 C) (Oral)   Ht 5' 5 (1.651 m)   Wt 160 lb 3.2 oz (72.7 kg)   LMP 07/23/2003 Comment: IUD.  LMP 12 yrs ago.  SpO2 100%   BMI  26.66 kg/m    Physical Exam Constitutional:      General: She is not in acute distress.    Appearance: Normal appearance. She is obese. She is not ill-appearing, toxic-appearing or diaphoretic.  HENT:     Head: Normocephalic.     Nose: Nose normal.     Mouth/Throat:     Mouth: Mucous membranes are moist.     Pharynx: Oropharynx is clear.  Eyes:     Extraocular Movements: Extraocular movements intact.     Pupils: Pupils are equal, round, and reactive to light.  Cardiovascular:     Rate and Rhythm: Normal rate and regular rhythm.     Pulses: Normal pulses.     Heart sounds: Normal heart sounds. No murmur heard.    No friction rub. No gallop.  Pulmonary:     Effort: No respiratory distress.     Breath sounds: No stridor. No wheezing, rhonchi or rales.  Chest:     Chest wall: No tenderness.   Musculoskeletal:     Right lower leg: No edema.     Left lower leg: No edema.  Skin:    General: Skin is warm and dry.     Capillary Refill: Capillary refill takes less than 2 seconds.  Neurological:     General: No focal deficit present.     Mental Status: She is alert and oriented to person, place, and time. Mental status is at baseline.  Psychiatric:        Mood and Affect: Mood normal.        Behavior: Behavior normal.        Thought Content: Thought content normal.        Judgment: Judgment normal.      No results found for any visits on 12/24/23.    The 10-year ASCVD risk score (Arnett DK, et al., 2019) is: 3.3%    Assessment & Plan:  Hypothyroidism, unspecified type -     TSH  Primary hypertension -     Lisinopril ; Take 1 tablet (20 mg total) by mouth daily.  Dispense: 90 tablet; Refill: 3 -     Basic metabolic panel with GFR  Mixed hyperlipidemia -     Lipid panel  Prediabetes -     Hemoglobin A1c  Chronic GERD -     Ambulatory referral to Gastroenterology  Oropharyngeal dysphagia -     Ambulatory referral to Gastroenterology     Assessment and Plan Assessment & Plan Pre-diabetes Pre-diabetes with a desire to discontinue metformin . No significant gastrointestinal symptoms currently. Will consider discontinuing metformin  if A1c improves. - Continue metformin  750 mg daily. - Check A1c today. - Consider discontinuing metformin  if A1c improves. - Continue to make conscious decisions for well balanced diet smaller portions with increase protein, fruits, veggies, water as drink of choice, decrease starches, processed foods, and saturated fats. Weekly exercise - 150 minutes per week.    Hypertension Hypertension with subtly elevated blood pressure today. Home monitoring is infrequent. She plans to set an alarm for weekly checks. - Continue lisinopril  20 mg daily. - Set an alarm to check blood pressure once a week in the morning. -  GOAL<130/80  Gastroesophageal reflux disease with dysphagia GERD with dysphagia, persistent cough, and phlegm, possibly exacerbated post-COVID. Current management includes omeprazole and Tums, but symptoms persist. Potential postnasal drip or sinus inflammation considered. Discussed potential need for GI referral and endoscopy. She is considering trying a different PPI and adding Pepcid if needed. - Consider trial  of different PPI such as Nexium. - Add Pepcid if needed. - Refer to gastroenterologist for further evaluation and possible endoscopy. - Continue current omeprazole until new PPI is started.  Hypothyroidism Hypothyroidism with current management in place. - Check thyroid  levels today. - Continue levothyroxine  50mcg   Return in about 6 months (around 06/25/2024) for Bp and Labs.   I, Curtis DELENA Boom, FNP, have reviewed all documentation for this visit. The documentation on 12/24/23 for the exam, diagnosis, procedures, and orders are all accurate and complete.   Curtis DELENA Boom, FNP

## 2023-12-25 ENCOUNTER — Ambulatory Visit: Payer: Self-pay | Admitting: Family Medicine

## 2023-12-25 LAB — BASIC METABOLIC PANEL WITH GFR
BUN/Creatinine Ratio: 22 (ref 9–23)
BUN: 15 mg/dL (ref 6–24)
CO2: 21 mmol/L (ref 20–29)
Calcium: 9.7 mg/dL (ref 8.7–10.2)
Chloride: 101 mmol/L (ref 96–106)
Creatinine, Ser: 0.68 mg/dL (ref 0.57–1.00)
Glucose: 97 mg/dL (ref 70–99)
Potassium: 4.6 mmol/L (ref 3.5–5.2)
Sodium: 139 mmol/L (ref 134–144)
eGFR: 102 mL/min/1.73 (ref >59)

## 2023-12-25 LAB — HEMOGLOBIN A1C
Est. average glucose Bld gHb Est-mCnc: 111 mg/dL
Hgb A1c MFr Bld: 5.5 % (ref 4.8–5.6)

## 2023-12-25 LAB — TSH: TSH: 0.896 u[IU]/mL (ref 0.450–4.500)

## 2023-12-25 LAB — LIPID PANEL
Chol/HDL Ratio: 3.7 ratio (ref 0.0–4.4)
Cholesterol, Total: 249 mg/dL — ABNORMAL HIGH (ref 100–199)
HDL: 68 mg/dL (ref >39)
LDL Chol Calc (NIH): 141 mg/dL — ABNORMAL HIGH (ref 0–99)
Triglycerides: 224 mg/dL — ABNORMAL HIGH (ref 0–149)
VLDL Cholesterol Cal: 40 mg/dL (ref 5–40)

## 2023-12-26 ENCOUNTER — Other Ambulatory Visit: Payer: Self-pay | Admitting: Family Medicine

## 2023-12-26 ENCOUNTER — Encounter: Payer: Self-pay | Admitting: Family Medicine

## 2023-12-26 DIAGNOSIS — E782 Mixed hyperlipidemia: Secondary | ICD-10-CM

## 2023-12-26 NOTE — Progress Notes (Signed)
 Seen by patient on 12/25/2023 6:04 pm.

## 2024-01-13 ENCOUNTER — Ambulatory Visit: Admitting: Dermatology

## 2024-03-10 ENCOUNTER — Encounter: Payer: Self-pay | Admitting: Gastroenterology

## 2024-03-10 NOTE — Anesthesia Preprocedure Evaluation (Addendum)
 Anesthesia Evaluation  Patient identified by MRN, date of birth, ID band Patient awake and Patient confused    Reviewed: Allergy & Precautions, H&P , NPO status , Patient's Chart, lab work & pertinent test results  Airway Mallampati: I  TM Distance: >3 FB Neck ROM: Full    Dental no notable dental hx.    Pulmonary neg pulmonary ROS   Pulmonary exam normal breath sounds clear to auscultation       Cardiovascular hypertension, Normal cardiovascular exam Rhythm:Regular Rate:Normal     Neuro/Psych  Headaches PSYCHIATRIC DISORDERS Anxiety     negative neurological ROS  negative psych ROS   GI/Hepatic negative GI ROS, Neg liver ROS,GERD  ,,  Endo/Other  negative endocrine ROSHypothyroidism    Renal/GU negative Renal ROS  negative genitourinary   Musculoskeletal negative musculoskeletal ROS (+) Arthritis ,    Abdominal   Peds negative pediatric ROS (+)  Hematology negative hematology ROS (+)   Anesthesia Other Findings Thyroid  disease  Hypertension Hyperlipidemia  Anxiety Allergy  History of dysplastic nevus GERD (gastroesophageal reflux disease) Pre-diabetes Headache  Abnormal liver enzymes Metabolic dysfunction-associated steatotic liver disease (MASLD     Reproductive/Obstetrics negative OB ROS                              Anesthesia Physical Anesthesia Plan  ASA: 3  Anesthesia Plan: MAC   Post-op Pain Management:    Induction: Intravenous  PONV Risk Score and Plan:   Airway Management Planned: Natural Airway and Nasal Cannula  Additional Equipment:   Intra-op Plan:   Post-operative Plan:   Informed Consent: I have reviewed the patients History and Physical, chart, labs and discussed the procedure including the risks, benefits and alternatives for the proposed anesthesia with the patient or authorized representative who has indicated his/her understanding and  acceptance.     Dental Advisory Given  Plan Discussed with: Anesthesiologist, CRNA and Surgeon  Anesthesia Plan Comments: (Patient consented for risks of anesthesia including but not limited to:  - adverse reactions to medications - damage to eyes, teeth, lips or other oral mucosa - nerve damage due to positioning  - sore throat or hoarseness - Damage to heart, brain, nerves, lungs, other parts of body or loss of life  Patient voiced understanding and assent.)         Anesthesia Quick Evaluation

## 2024-03-16 ENCOUNTER — Encounter: Payer: Self-pay | Admitting: Gastroenterology

## 2024-03-16 ENCOUNTER — Ambulatory Visit: Payer: Self-pay | Admitting: Anesthesiology

## 2024-03-16 ENCOUNTER — Other Ambulatory Visit: Payer: Self-pay

## 2024-03-16 ENCOUNTER — Encounter
Admission: RE | Disposition: A | Discharge status: Home / Self Care | Payer: Self-pay | Source: Home / Self Care | Attending: Gastroenterology

## 2024-03-16 ENCOUNTER — Ambulatory Visit
Admission: RE | Admit: 2024-03-16 | Discharge: 2024-03-16 | Disposition: A | Discharge status: Home / Self Care | Attending: Gastroenterology | Admitting: Gastroenterology

## 2024-03-16 DIAGNOSIS — K219 Gastro-esophageal reflux disease without esophagitis: Secondary | ICD-10-CM | POA: Insufficient documentation

## 2024-03-16 DIAGNOSIS — F458 Other somatoform disorders: Secondary | ICD-10-CM | POA: Diagnosis not present

## 2024-03-16 DIAGNOSIS — K3189 Other diseases of stomach and duodenum: Secondary | ICD-10-CM | POA: Diagnosis not present

## 2024-03-16 DIAGNOSIS — I10 Essential (primary) hypertension: Secondary | ICD-10-CM | POA: Insufficient documentation

## 2024-03-16 DIAGNOSIS — E039 Hypothyroidism, unspecified: Secondary | ICD-10-CM | POA: Diagnosis not present

## 2024-03-16 HISTORY — DX: Fatty (change of) liver, not elsewhere classified: K76.0

## 2024-03-16 HISTORY — DX: Abnormal levels of other serum enzymes: R74.8

## 2024-03-16 HISTORY — DX: Gastro-esophageal reflux disease without esophagitis: K21.9

## 2024-03-16 HISTORY — DX: Prediabetes: R73.03

## 2024-03-16 HISTORY — DX: Headache, unspecified: R51.9

## 2024-03-16 HISTORY — PX: ESOPHAGOGASTRODUODENOSCOPY: SHX5428

## 2024-03-16 LAB — GLUCOSE, CAPILLARY: Glucose-Capillary: 101 mg/dL — ABNORMAL HIGH (ref 70–99)

## 2024-03-16 SURGERY — EGD (ESOPHAGOGASTRODUODENOSCOPY)
Anesthesia: Monitor Anesthesia Care

## 2024-03-16 MED ORDER — LACTATED RINGERS IV SOLN: INTRAVENOUS | Status: DC

## 2024-03-16 MED ORDER — PROPOFOL 10 MG/ML IV BOLUS
INTRAVENOUS | Status: DC | PRN
  Administered 2024-03-16 (×2): 30 mg via INTRAVENOUS
  Administered 2024-03-16: 70 mg via INTRAVENOUS
  Administered 2024-03-16: 20 mg via INTRAVENOUS

## 2024-03-16 MED ORDER — GLYCOPYRROLATE 0.2 MG/ML IJ SOLN
INTRAMUSCULAR | Status: DC | PRN
  Administered 2024-03-16: .2 mg via INTRAVENOUS

## 2024-03-16 MED ORDER — LIDOCAINE HCL (CARDIAC) PF 100 MG/5ML IV SOSY
PREFILLED_SYRINGE | INTRAVENOUS | Status: DC | PRN
  Administered 2024-03-16: 50 mg via INTRAVENOUS

## 2024-03-16 MED ORDER — STERILE WATER FOR IRRIGATION IR SOLN
Status: DC | PRN
  Administered 2024-03-16: 1

## 2024-03-16 MED ORDER — PROPOFOL 10 MG/ML IV BOLUS
INTRAVENOUS | Status: AC
  Filled 2024-03-16: qty 20

## 2024-03-16 MED ORDER — LIDOCAINE HCL (PF) 2 % IJ SOLN
INTRAMUSCULAR | Status: AC
  Filled 2024-03-16: qty 5

## 2024-03-16 MED ORDER — GLYCOPYRROLATE 0.2 MG/ML IJ SOLN
INTRAMUSCULAR | Status: AC
  Filled 2024-03-16: qty 1

## 2024-03-16 SURGICAL SUPPLY — 6 items
BLOCK BITE 60FR ADLT L/F GRN (MISCELLANEOUS) ×1 IMPLANT
FORCEPS BIOP RAD 4 LRG CAP 4 (CUTTING FORCEPS) IMPLANT
GOWN CVR UNV OPN BCK APRN NK (MISCELLANEOUS) ×2 IMPLANT
KIT PRC NS LF DISP ENDO (KITS) ×1 IMPLANT
MANIFOLD NEPTUNE II (INSTRUMENTS) ×1 IMPLANT
WATER STERILE IRR 250ML POUR (IV SOLUTION) ×1 IMPLANT

## 2024-03-16 NOTE — Anesthesia Postprocedure Evaluation (Signed)
 Anesthesia Post Note  Patient: Tami Finley  Procedure(s) Performed: EGD (ESOPHAGOGASTRODUODENOSCOPY)  Patient location during evaluation: PACU Anesthesia Type: MAC Level of consciousness: awake and alert Pain management: pain level controlled Vital Signs Assessment: post-procedure vital signs reviewed and stable Respiratory status: spontaneous breathing, nonlabored ventilation, respiratory function stable and patient connected to nasal cannula oxygen Cardiovascular status: blood pressure returned to baseline and stable Postop Assessment: no apparent nausea or vomiting Anesthetic complications: no   No notable events documented.   Last Vitals:  Vitals:   03/16/24 1050 03/16/24 1055  BP:  122/78  Pulse: 88 85  Resp: 16 19  Temp:  36.4 C  SpO2: 98% 98%    Last Pain:  Vitals:   03/16/24 1055  TempSrc:   PainSc: 0-No pain                 Sherolyn Trettin C Nilesh Stegall

## 2024-03-16 NOTE — Op Note (Signed)
 [facility] Patient Name: Tami Finley Procedure Date: 03/16/2024 10:24 AM MRN: [id number] Account #: [id number] Date of Birth: [birth year] Admit Type: Outpatient Age: 58 Room: [facility] OR ROOM 01 Gender: Female Note Status: Finalized Instrument Name: Endoscope [id number] Procedure:             Upper GI endoscopy Indications:           Esophageal reflux symptoms that persist despite                         appropriate therapy Providers:             Corinn Jess Brooklyn MD, MD Referring MD:          Curtis LABOR. Wellington (Referring MD) Medicines:             General Anesthesia Complications:         No immediate complications. Estimated blood loss: None. Procedure:             Pre-Anesthesia Assessment:                        - Prior to the procedure, a History and Physical was                         performed, and patient medications and allergies were                         reviewed. The patient is competent. The risks and                         benefits of the procedure and the sedation options and                         risks were discussed with the patient. All questions                         were answered and informed consent was obtained.                         Patient identification and proposed procedure were                         verified by the physician, the nurse, the                         anesthesiologist, the anesthetist and the technician                         in the pre-procedure area in the procedure room in the                         endoscopy suite. Mental Status Examination: alert and                         oriented. Airway Examination: normal oropharyngeal                         airway and neck mobility. Respiratory Examination:  clear to auscultation. CV Examination: normal.                         Prophylactic Antibiotics: The patient does not require                         prophylactic antibiotics.  Prior Anticoagulants: The                         patient has taken no anticoagulant or antiplatelet                         agents. ASA Grade Assessment: III - A patient with                         severe systemic disease. After reviewing the risks and                         benefits, the patient was deemed in satisfactory                         condition to undergo the procedure. The anesthesia                         plan was to use general anesthesia. Immediately prior                         to administration of medications, the patient was                         re-assessed for adequacy to receive sedatives. The                         heart rate, respiratory rate, oxygen saturations,                         blood pressure, adequacy of pulmonary ventilation, and                         response to care were monitored throughout the                         procedure. The physical status of the patient was                         re-assessed after the procedure.                        After obtaining informed consent, the endoscope was                         passed under direct vision. Throughout the procedure,                         the patient's blood pressure, pulse, and oxygen                         saturations were monitored continuously. The Endoscope  was introduced through the mouth, and advanced to the                         second part of duodenum. The upper GI endoscopy was                         accomplished without difficulty. The patient tolerated                         the procedure well. Findings:      The duodenal bulb and second portion of the duodenum were normal.      Striped mildly erythematous mucosa without bleeding was found in the       gastric body and in the gastric antrum. Biopsies were taken with a cold       forceps for Helicobacter pylori testing.      The cardia and gastric fundus were normal on retroflexion.       Esophagogastric landmarks were identified: the gastroesophageal junction       was found at 34 cm from the incisors.      The gastroesophageal junction and examined esophagus were normal.       Biopsies were taken with a cold forceps for histology. Impression:            - Normal duodenal bulb and second portion of the                         duodenum.                        - Erythematous mucosa in the gastric body and antrum.                         Biopsied.                        - Esophagogastric landmarks identified.                        - Normal gastroesophageal junction and esophagus.                         Biopsied. Recommendation:        - Await pathology results.                        - Discharge patient to home (with escort).                        - Resume previous diet today.                        - Continue present medications. Procedure Code(s):     --- Professional ---                        [phone], Esophagogastroduodenoscopy, flexible,                         transoral; with biopsy, single or multiple Diagnosis Code(s):     --- Professional ---  K31.89, Other diseases of stomach and duodenum                        K21.9, Gastro-esophageal reflux disease without                         esophagitis CPT copyright 2022 American Medical Association. All rights reserved. The codes documented in this report are preliminary and upon coder review may  be revised to meet current compliance requirements. Dr. Corinn Brooklyn Corinn Jess Brooklyn MD, MD 03/16/2024 10:42:36 AM This report has been signed electronically. Number of Addenda: 0 Note Initiated On: 03/16/2024 10:24 AM Total Procedure Duration: 0 hours 4 minutes 31 seconds  Estimated Blood Loss:  Estimated blood loss: none.      [facility]

## 2024-03-16 NOTE — H&P (Signed)
 Tami JONELLE Brooklyn, MD [facility], DHIP [address]  [place], [place] [address]  Main: [phone] Fax:  [phone] Pager: [phone]   Primary Care Physician:  Wellington Curtis LABOR, FNP Primary Gastroenterologist:  Dr. Corinn JONELLE Finley  Pre-Procedure History & Physical: HPI:  Tami Finley is a 58 y.o. female is here for an endoscopy.   Past Medical History:  Diagnosis Date   Abnormal liver enzymes    Allergy    Anxiety    GERD (gastroesophageal reflux disease)    Headache    occular migraines   History of dysplastic nevus 10/13/2018   DYSPLASTIC COMPOUND NEVUS WITH MILD ATYPIA, CLOSE TO MARGIN  -  right foot dorsum   Hyperlipidemia    Hypertension    Metabolic dysfunction-associated steatotic liver disease (MASLD)    Pre-diabetes    Thyroid  disease     Past Surgical History:  Procedure Laterality Date   CHOLECYSTECTOMY  06/04/2007   COLONOSCOPY N/A 08/19/2023   Procedure: COLONOSCOPY;  Surgeon: Jinny Carmine, MD;  Location: [facility] ENDOSCOPY;  Service: Endoscopy;  Laterality: N/A;    Prior to Admission medications   Medication Sig Start Date End Date Taking? Authorizing Provider  Ascorbic Acid (VITAMIN C) 100 MG tablet Take 100 mg by mouth daily.   Yes [provider]  ferrous sulfate 325 (65 FE) MG EC tablet Take 325 mg by mouth daily.   Yes [provider]  levothyroxine  (SYNTHROID ) 50 MCG tablet Take 1 tablet (50 mcg total) by mouth daily. 12/22/23  Yes Clifton, Kellie A, FNP  lisinopril  (ZESTRIL ) 20 MG tablet Take 1 tablet (20 mg total) by mouth daily. 12/24/23  Yes Wellington Curtis LABOR, FNP  metFORMIN  (GLUCOPHAGE -XR) 750 MG 24 hr tablet Take 1 tablet (750 mg total) by mouth daily with breakfast. 04/16/23  Yes Emilio Kelly DASEN, FNP  Multiple Vitamins-Minerals (ZINC PO) Take by mouth.   Yes [provider]    Allergies as of 03/05/2024 - Review Complete 12/24/2023  Allergen Reaction Noted   Other     Grass  pollen(k-o-r-t-swt vern) Rash 10/07/2023   Penicillins Rash 03/01/2015    Family History  Problem Relation Age of Onset   Hypertension Mother    Hyperlipidemia Mother    Thyroid  disease Mother    Hypertension Father    Hyperlipidemia Father    Diabetes Sister    Hypertension Sister    Thyroid  disease Sister    Thyroid  disease Maternal Grandmother    Breast cancer Other     Social History   Socioeconomic History   Marital status: Married    Spouse name: Not on file   Number of children: 2   Years of education: H/S   Highest education level: Not on file  Occupational History   Occupation: Full-Time  Tobacco Use   Smoking status: Never   Smokeless tobacco: Never  Vaping Use   Vaping status: Never Used  Substance and Sexual Activity   Alcohol use: Yes    Comment: occasionally   Drug use: No   Sexual activity: Not Currently    Birth control/protection: Post-menopausal  Other Topics Concern   Not on file  Social History Narrative   Not on file   Social Drivers of Health   Financial Resource Strain: Low Risk  (03/04/2024)   Received from [facility]   Overall Financial Resource Strain (CARDIA)    Difficulty of Paying Living Expenses: Not hard at all  Food Insecurity: No Food Insecurity (  03/04/2024)   Received from [facility]   Hunger Vital Sign    Within the past 12 months, you worried that your food would run out before you got the money to buy more.: Never true    Within the past 12 months, the food you bought just didn't last and you didn't have money to get more.: Never true  Transportation Needs: No Transportation Needs (03/04/2024)   Received from [facility]    In the past 12 months, has lack of transportation kept you from medical appointments or from getting medications?: No    Lack of Transportation (Non-Medical): No  Physical Activity: Not on file  Stress: Not on file   Social Connections: Not on file  Intimate Partner Violence: Not on file    Review of Systems: See HPI, otherwise negative ROS  Physical Exam: BP 126/83   Pulse 73   Temp 98 F (36.7 C) (Temporal)   Resp 16   Ht 5' 5 (1.651 m)   Wt 74.3 kg   LMP 07/23/2003 Comment: IUD.  LMP 12 yrs ago.  SpO2 100%   BMI 27.24 kg/m  General:   Alert,  pleasant and cooperative in NAD Head:  Normocephalic and atraumatic. Neck:  Supple; no masses or thyromegaly. Lungs:  Clear throughout to auscultation.    Heart:  Regular rate and rhythm. Abdomen:  Soft, nontender and nondistended. Normal bowel sounds, without guarding, and without rebound.   Neurologic:  Alert and  oriented x4;  grossly normal neurologically.  Impression/Plan: Tami Finley is here for an endoscopy to be performed for Gastroesophageal reflux disease with globus sensation   Risks, benefits, limitations, and alternatives regarding  endoscopy have been reviewed with the patient.  Questions have been answered.  All parties agreeable.   Tami Brooklyn, MD  03/16/2024, 9:46 AM

## 2024-03-16 NOTE — Transfer of Care (Signed)
 Immediate Anesthesia Transfer of Care Note  Patient: Tami Finley  Procedure(s) Performed: EGD (ESOPHAGOGASTRODUODENOSCOPY)  Patient Location: PACU  Anesthesia Type: MAC  Level of Consciousness: awake, alert  and patient cooperative  Airway and Oxygen Therapy: Patient Spontanous Breathing and Patient connected to supplemental oxygen  Post-op Assessment: Post-op Vital signs reviewed, Patient's Cardiovascular Status Stable, Respiratory Function Stable, Patent Airway and No signs of Nausea or vomiting  Post-op Vital Signs: Reviewed and stable  Complications: No notable events documented.

## 2024-03-26 ENCOUNTER — Other Ambulatory Visit: Payer: Self-pay

## 2024-03-26 DIAGNOSIS — Z1231 Encounter for screening mammogram for malignant neoplasm of breast: Secondary | ICD-10-CM

## 2024-04-19 ENCOUNTER — Telehealth: Payer: Self-pay | Admitting: Family Medicine

## 2024-04-19 ENCOUNTER — Other Ambulatory Visit: Payer: Self-pay

## 2024-04-19 DIAGNOSIS — R7303 Prediabetes: Secondary | ICD-10-CM

## 2024-04-19 MED ORDER — METFORMIN HCL ER 750 MG PO TB24: 750.0000 mg | ORAL_TABLET | Freq: Every day | ORAL | 0 refills | Status: DC

## 2024-04-19 NOTE — Telephone Encounter (Signed)
 [facility] faxed refill request for the following medications:   metFORMIN (GLUCOPHAGE-XR) 750 MG 24 hr tablet     Please advise.

## 2024-04-19 NOTE — Telephone Encounter (Signed)
 Converted to rx refill

## 2024-04-20 ENCOUNTER — Other Ambulatory Visit: Payer: Self-pay

## 2024-04-20 DIAGNOSIS — R7303 Prediabetes: Secondary | ICD-10-CM

## 2024-04-20 MED ORDER — METFORMIN HCL ER 750 MG PO TB24: 750.0000 mg | ORAL_TABLET | Freq: Every day | ORAL | 0 refills | Status: DC

## 2024-04-20 NOTE — Telephone Encounter (Signed)
 Received another refill [facility] for [facility] from [facility].  Looks like [facility] [place].  Please send to the correct pharmacy.

## 2024-04-20 NOTE — Telephone Encounter (Signed)
 [facility].

## 2024-06-25 ENCOUNTER — Ambulatory Visit: Admitting: Family Medicine

## 2024-06-25 ENCOUNTER — Ambulatory Visit

## 2024-06-25 DIAGNOSIS — I10 Essential (primary) hypertension: Secondary | ICD-10-CM

## 2024-06-25 DIAGNOSIS — E039 Hypothyroidism, unspecified: Secondary | ICD-10-CM

## 2024-06-25 DIAGNOSIS — R7303 Prediabetes: Secondary | ICD-10-CM

## 2024-06-25 MED ORDER — METFORMIN HCL ER 750 MG PO TB24: 750.0000 mg | ORAL_TABLET | Freq: Every day | ORAL | 3 refills | Status: AC

## 2024-06-25 MED ORDER — LISINOPRIL 20 MG PO TABS: 20.0000 mg | ORAL_TABLET | Freq: Every day | ORAL | 3 refills | Status: AC

## 2024-06-25 MED ORDER — LEVOTHYROXINE SODIUM 50 MCG PO TABS: 50.0000 ug | ORAL_TABLET | Freq: Every day | ORAL | 3 refills | Status: AC

## 2024-06-25 NOTE — Progress Notes (Unsigned)
" °  ° ° °  Established patient visit   Patient: Tami Finley   DOB: 07-01-1965   59 y.o. Female  MRN: [id number] Visit Date: 06/25/2024  Today's healthcare provider: Isaiah DELENA Pepper, MD   Chief Complaint  Patient presents with   Transitions Of Care   Subjective    HPI  Discussed the use of AI scribe software for clinical note transcription with the patient, who gave verbal consent to proceed.  History of Present Illness   The 10-year ASCVD risk score (Arnett DK, et al., 2019) is: 3.6%   Medications: Show/hide medication list[1]  Review of Systems as noted in HPI.  {Insert previous labs (optional):23779} {See past labs  Heme  Chem  Endocrine  Serology  Results Review (optional):1}   Objective    BP 132/72   Pulse 98   Temp 97.7 F (36.5 C) (Oral)   Wt 164 lb 8 oz (74.6 kg)   LMP 07/23/2003 Comment: IUD.  LMP 12 yrs ago.  SpO2 100%   BMI 27.37 kg/m  {Insert last BP/Wt (optional):23777}{See vitals history (optional):1}  Physical Exam   No results found for any visits on 06/25/24.  Assessment & Plan     Problem List Items Addressed This Visit       Cardiovascular and Mediastinum   Hypertension   Relevant Medications   lisinopril  (ZESTRIL ) 20 MG tablet   Other Relevant Orders   Basic metabolic panel with GFR     Endocrine   Hypothyroidism   Relevant Medications   levothyroxine  (SYNTHROID ) 50 MCG tablet   Other Relevant Orders   TSH     Other   Prediabetes   Relevant Medications   metFORMIN  (GLUCOPHAGE -XR) 750 MG 24 hr tablet   Assessment & Plan      No follow-ups on file.       Isaiah DELENA Pepper, MD  [facility] [phone] (phone) [phone] (fax)     [1]  Outpatient Medications Prior to Visit  Medication Sig   Ascorbic Acid (VITAMIN C) 100 MG tablet Take 100 mg by mouth daily.   ferrous sulfate 325 (65 FE) MG EC tablet Take 325 mg by mouth daily.   Multiple Vitamins-Minerals (ZINC PO) Take by  mouth.   [DISCONTINUED] levothyroxine  (SYNTHROID ) 50 MCG tablet Take 1 tablet (50 mcg total) by mouth daily.   [DISCONTINUED] lisinopril  (ZESTRIL ) 20 MG tablet Take 1 tablet (20 mg total) by mouth daily.   [DISCONTINUED] metFORMIN  (GLUCOPHAGE -XR) 750 MG 24 hr tablet Take 1 tablet (750 mg total) by mouth daily with breakfast.   omeprazole (PRILOSEC) 20 MG capsule Take 20 mg by mouth daily.   No facility-administered medications prior to visit.   "

## 2024-09-22 ENCOUNTER — Ambulatory Visit

## 2024-10-19 ENCOUNTER — Encounter: Admitting: Dermatology

## 2024-10-26 ENCOUNTER — Encounter: Admitting: Dermatology
